# Patient Record
Sex: Female | Born: 1955 | Race: Black or African American | Hispanic: No | Marital: Single | Smoking: Current every day smoker
Health system: Southern US, Community
[De-identification: ages and names within clinical notes are randomized; demographics above are authoritative.]

## PROBLEM LIST (undated history)

## (undated) DIAGNOSIS — E119 Type 2 diabetes mellitus without complications: Secondary | ICD-10-CM

## (undated) DIAGNOSIS — E7800 Pure hypercholesterolemia, unspecified: Secondary | ICD-10-CM

## (undated) DIAGNOSIS — F419 Anxiety disorder, unspecified: Secondary | ICD-10-CM

## (undated) DIAGNOSIS — F32A Depression, unspecified: Secondary | ICD-10-CM

## (undated) DIAGNOSIS — I10 Essential (primary) hypertension: Secondary | ICD-10-CM

## (undated) HISTORY — PX: OTHER SURGICAL HISTORY: SHX169

## (undated) HISTORY — PX: ABDOMINAL HYSTERECTOMY: SHX81

---

## 2007-08-04 ENCOUNTER — Emergency Department (HOSPITAL_COMMUNITY): Admission: EM | Admit: 2007-08-04 | Discharge: 2007-08-04 | Payer: Self-pay | Admitting: Emergency Medicine

## 2007-12-29 ENCOUNTER — Emergency Department (HOSPITAL_COMMUNITY): Admission: EM | Admit: 2007-12-29 | Discharge: 2007-12-29 | Payer: Self-pay | Admitting: Emergency Medicine

## 2008-02-03 ENCOUNTER — Emergency Department (HOSPITAL_COMMUNITY): Admission: EM | Admit: 2008-02-03 | Discharge: 2008-02-03 | Payer: Self-pay | Admitting: Emergency Medicine

## 2008-03-01 ENCOUNTER — Ambulatory Visit (HOSPITAL_COMMUNITY): Admission: RE | Admit: 2008-03-01 | Discharge: 2008-03-01 | Payer: Self-pay | Admitting: Family Medicine

## 2008-03-03 ENCOUNTER — Emergency Department (HOSPITAL_COMMUNITY): Admission: EM | Admit: 2008-03-03 | Discharge: 2008-03-03 | Payer: Self-pay | Admitting: Emergency Medicine

## 2008-05-02 ENCOUNTER — Emergency Department (HOSPITAL_COMMUNITY): Admission: EM | Admit: 2008-05-02 | Discharge: 2008-05-02 | Payer: Self-pay | Admitting: Emergency Medicine

## 2008-05-31 ENCOUNTER — Emergency Department (HOSPITAL_COMMUNITY): Admission: EM | Admit: 2008-05-31 | Discharge: 2008-05-31 | Payer: Self-pay | Admitting: Emergency Medicine

## 2008-06-28 ENCOUNTER — Emergency Department (HOSPITAL_COMMUNITY): Admission: EM | Admit: 2008-06-28 | Discharge: 2008-06-28 | Payer: Self-pay | Admitting: Emergency Medicine

## 2008-07-02 ENCOUNTER — Ambulatory Visit (HOSPITAL_COMMUNITY): Admission: RE | Admit: 2008-07-02 | Discharge: 2008-07-02 | Payer: Self-pay | Admitting: General Surgery

## 2008-07-07 ENCOUNTER — Observation Stay (HOSPITAL_COMMUNITY): Admission: RE | Admit: 2008-07-07 | Discharge: 2008-07-08 | Payer: Self-pay | Admitting: General Surgery

## 2008-07-07 ENCOUNTER — Encounter (INDEPENDENT_AMBULATORY_CARE_PROVIDER_SITE_OTHER): Payer: Self-pay | Admitting: General Surgery

## 2008-08-03 ENCOUNTER — Ambulatory Visit (HOSPITAL_COMMUNITY): Admission: RE | Admit: 2008-08-03 | Discharge: 2008-08-03 | Payer: Self-pay | Admitting: Orthopedic Surgery

## 2008-09-06 IMAGING — CT CT PELVIS W/ CM
1 of 3 series · 14 of 32 positions shown, 19 images · IV contrast (Omnipaque 300)
Comparison: None

CT ABDOMEN

CLINICAL DATA: Upper and mid abdominal pain, back pain, nausea,
vomiting, diabetes

CT ABDOMEN AND PELVIS WITH CONTRAST
TECHNIQUE: Multidetector CT imaging of the abdomen and pelvis was
performed using the standard protocol following bolus
administration of intravenous contrast. Sagittal and coronal images
reconstructed from axial data set.
Contrast: Dilute oral contrast and 100 ml [id number].

[Series 2: abd_pel 5.0 b40f · axial · 0.78mm/px · z∈[-467,-52]mm · 14 of 95 slices shown, 19 images]
[im 6/95  soft-tissue]
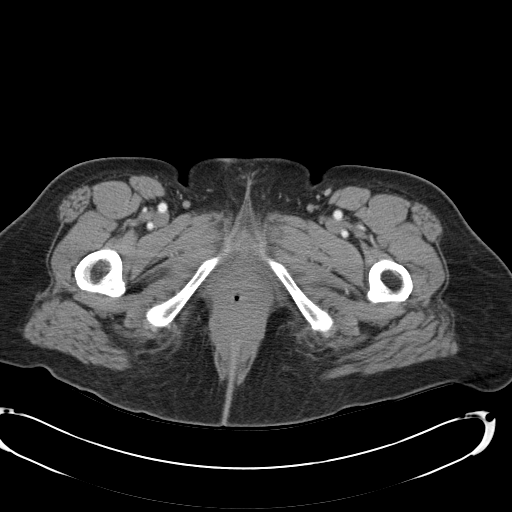
[im 6/95  bone]
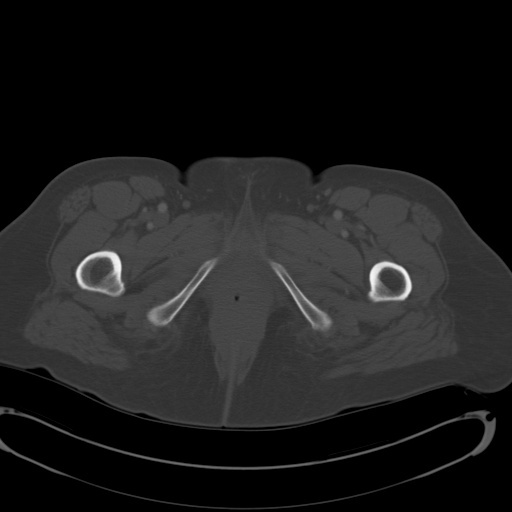
[im 12/95  soft-tissue]
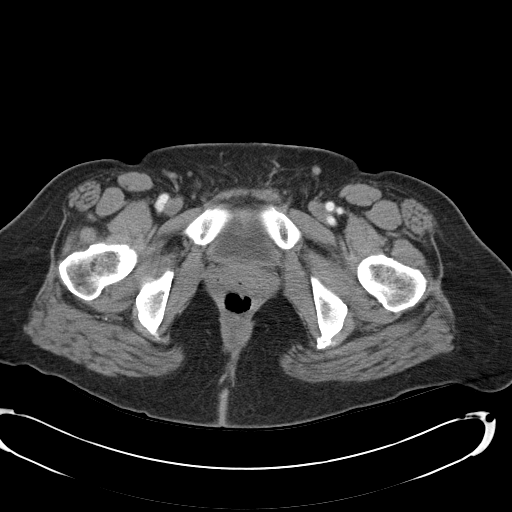
[im 23/95  soft-tissue]
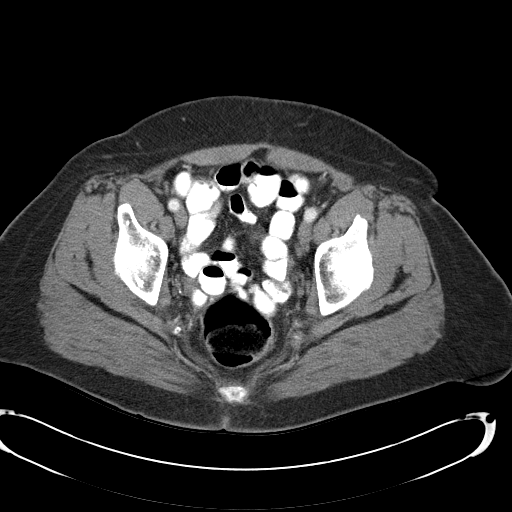
[im 28/95  soft-tissue]
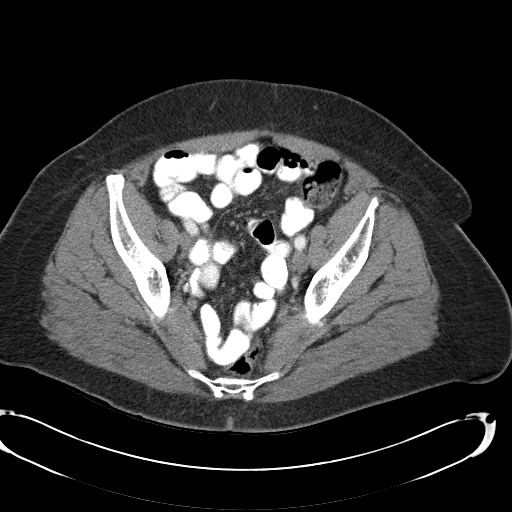
[im 34/95  soft-tissue]
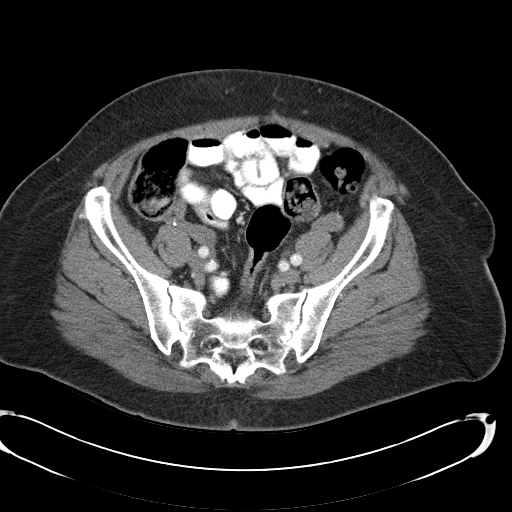
[im 39/95  soft-tissue]
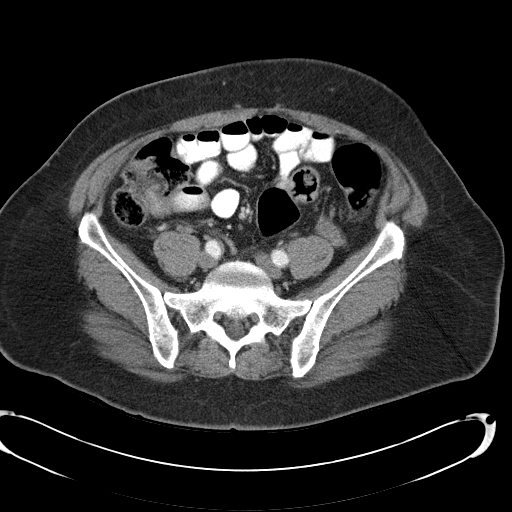
[im 50/95  soft-tissue]
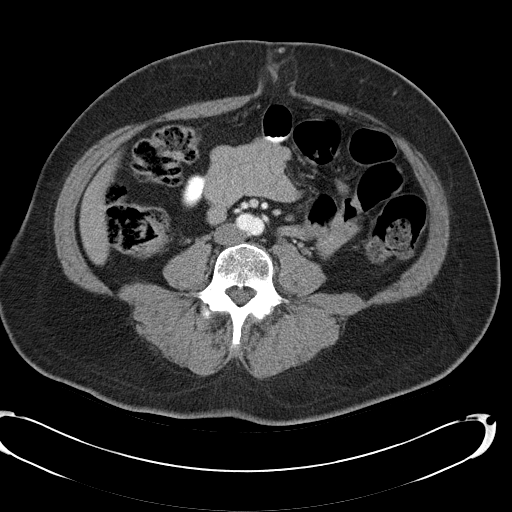
[im 56/95  soft-tissue]
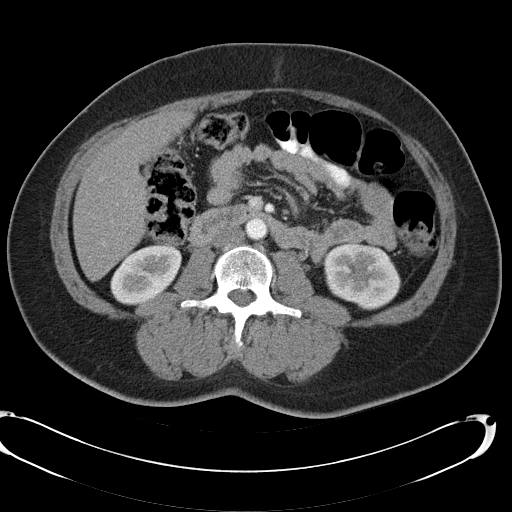
[im 61/95  soft-tissue]
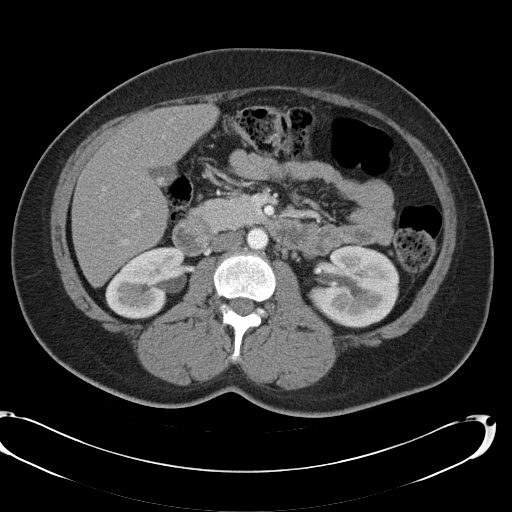
[im 61/95  bone]
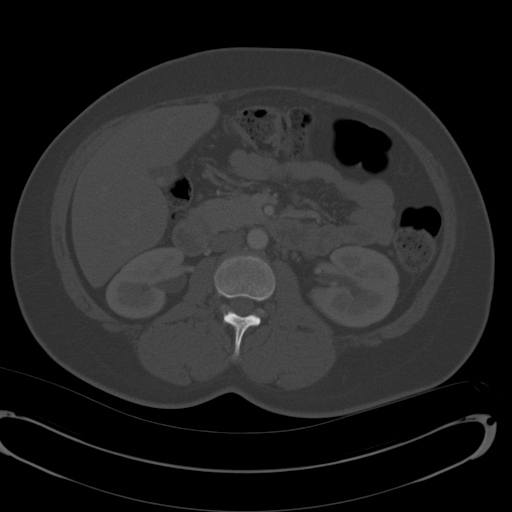
[im 67/95  soft-tissue]
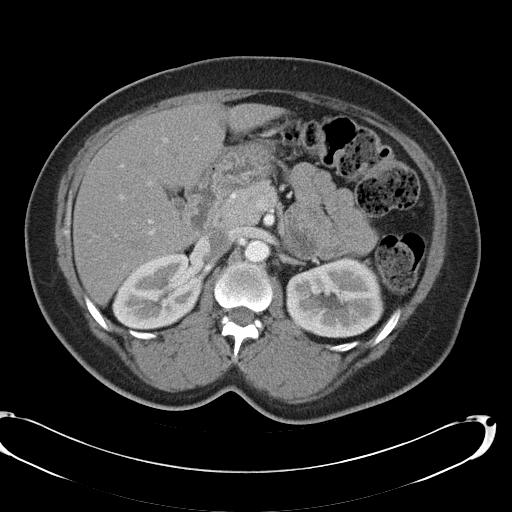
[im 72/95  soft-tissue]
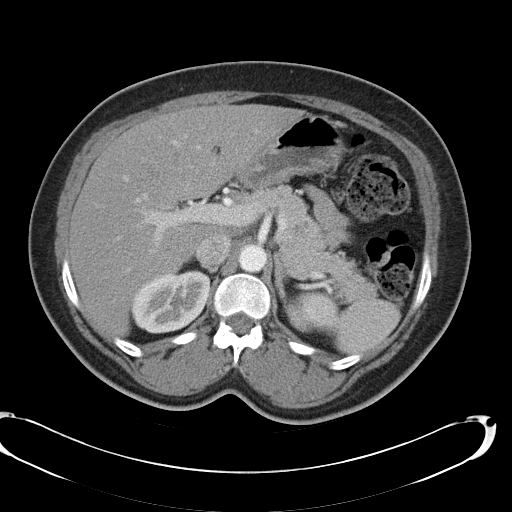
[im 72/95  lung]
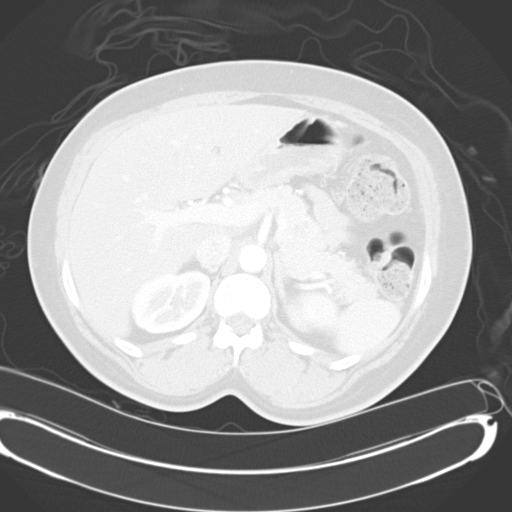
[im 78/95  lung]
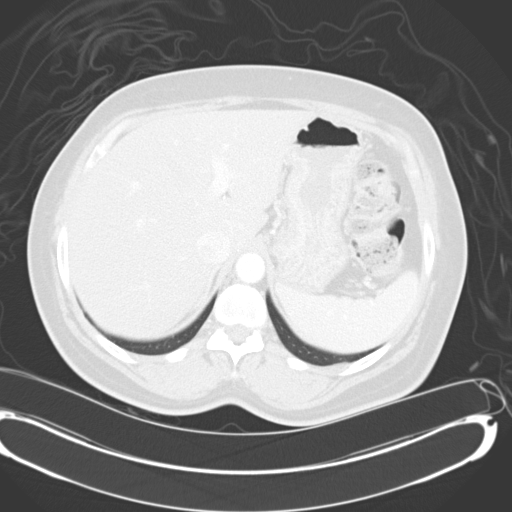
[im 83/95  soft-tissue]
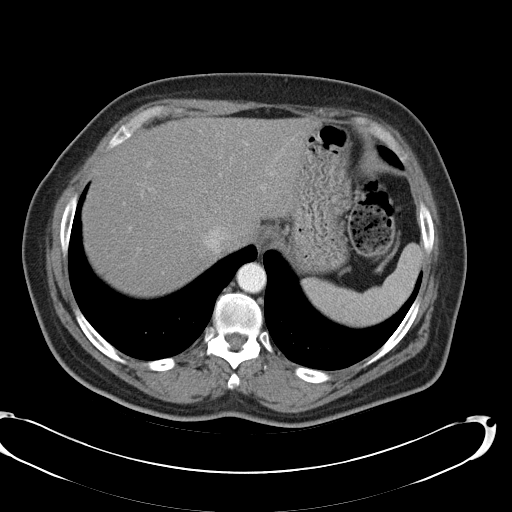
[im 83/95  lung]
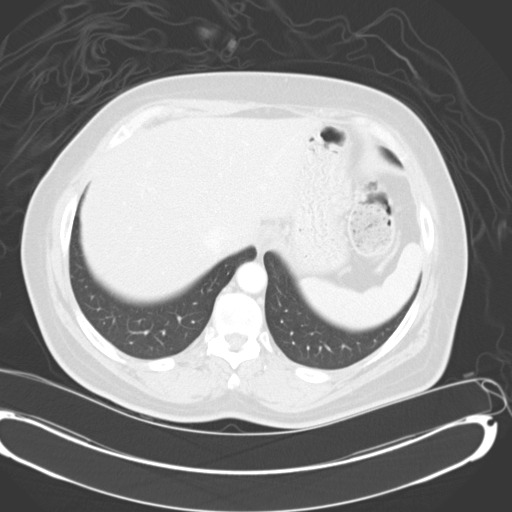
[im 89/95  soft-tissue]
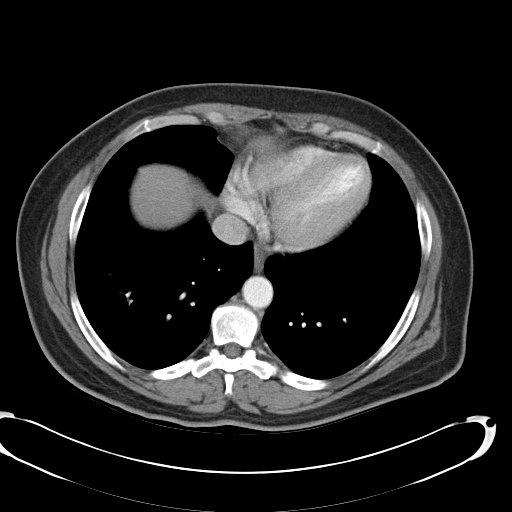
[im 89/95  lung]
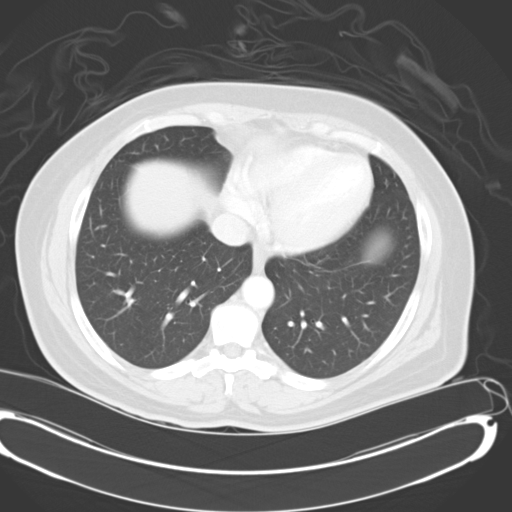

[14 of 32 positions shown; findings below may reference images not displayed]

FINDINGS: Lung bases clear.
Small umbilical hernia containing fat.
Cholelithiasis.
Mild focal fatty infiltration of liver adjacent to falciform
fissure.
Remainder of liver, spleen, pancreas, kidneys, and adrenal glands
normal.
Stool throughout colon.
Small bowel loops normal.
Stomach under distended, suboptimal assessment, no gross
abnormalities seen.
No upper abdominal mass, adenopathy, free fluid, or inflammatory
process.
IMPRESSION: Cholelithiasis.
Small umbilical hernia.
No acute upper abdominal abnormalities.

CT PELVIS
FINDINGS: Uterus surgically absent with nonvisualization of left ovary.
Right ovary normal size.
Scattered phleboliths left pelvis.
No mass, adenopathy, free fluid, or inflammatory process.
Bladder and distal ureters unremarkable.
Appendix not identified.
Bones unremarkable.
IMPRESSION: No acute intrapelvic abnormalities.

## 2009-01-05 IMAGING — US US ABDOMEN COMPLETE
1 series · 13 of 25 positions shown · non-contrast
Comparison: CT scan February 2008

CLINICAL DATA: Cholelithiasis

ABDOMEN ULTRASOUND
TECHNIQUE: Complete abdominal ultrasound examination was performed
including evaluation of the liver, gallbladder, bile ducts,
pancreas, kidneys, spleen, IVC, and abdominal aorta.

[Series 1: unknown · 0.25mm/px · 13 of 70 slices shown]
[im 1/70]
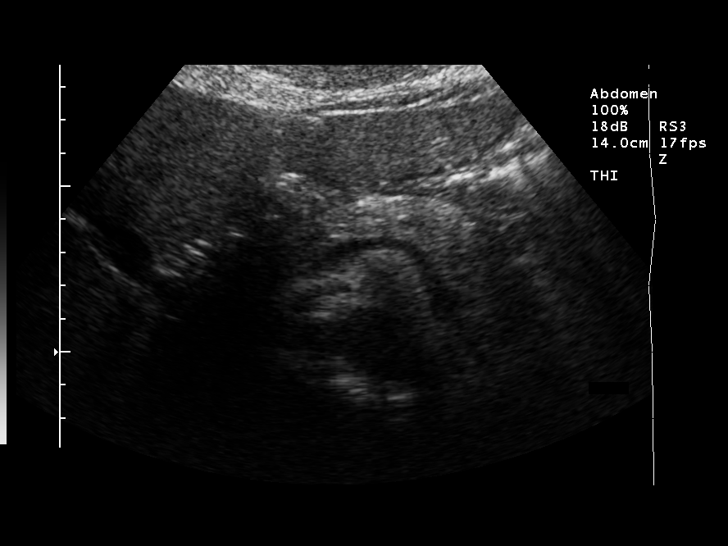
[im 6/70]
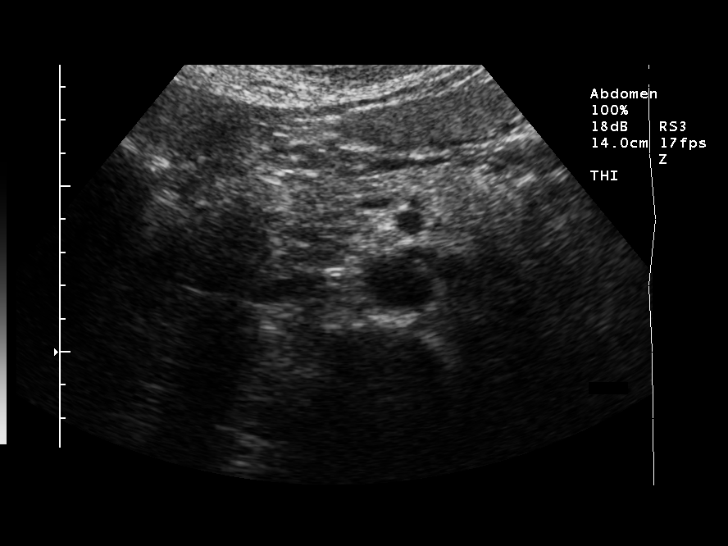
[im 12/70]
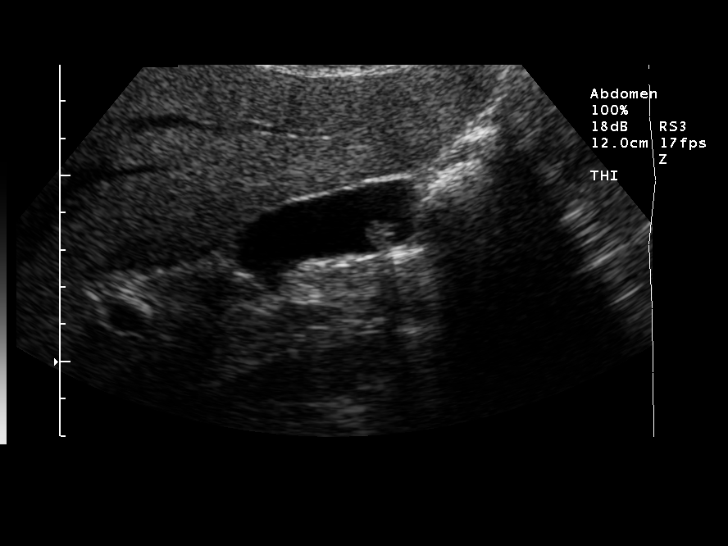
[im 18/70]
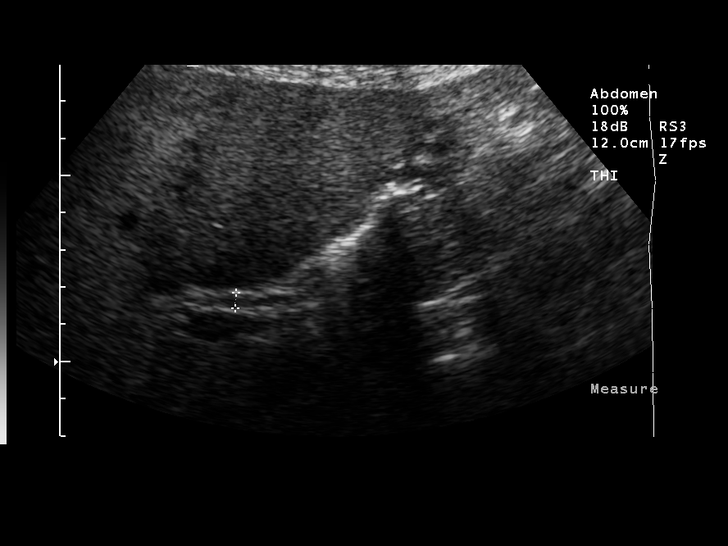
[im 24/70]
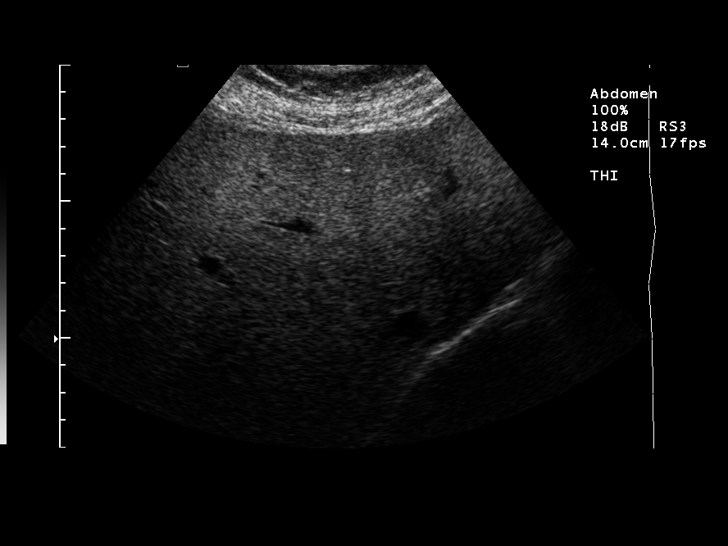
[im 29/70]
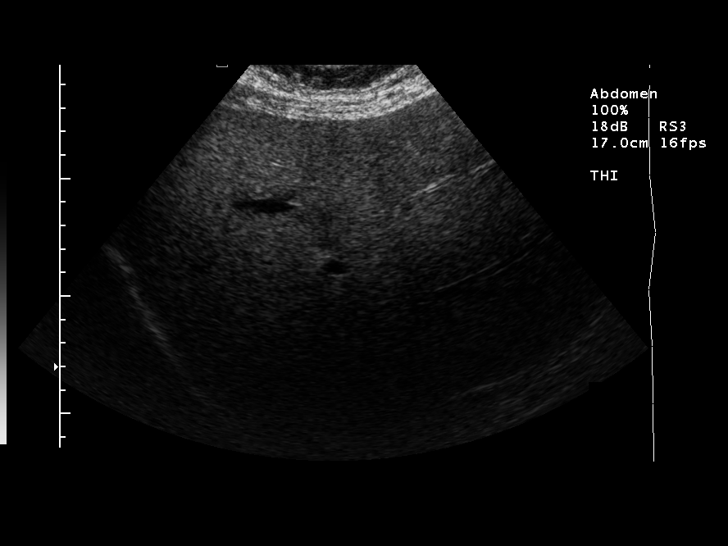
[im 35/70]
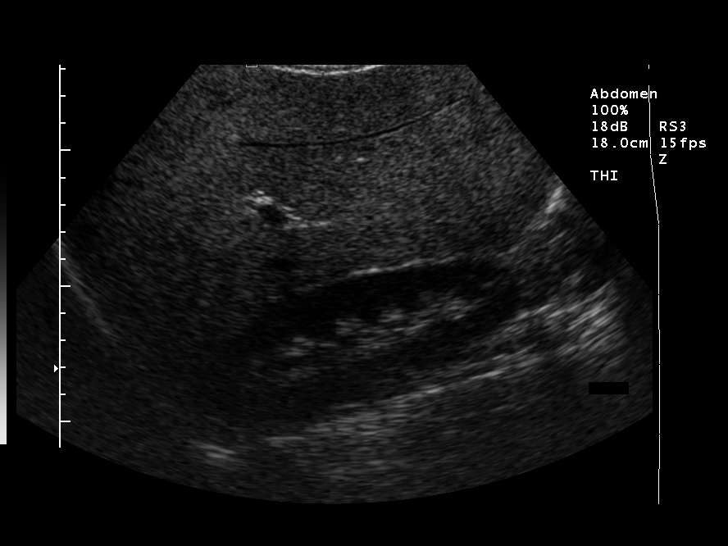
[im 41/70]
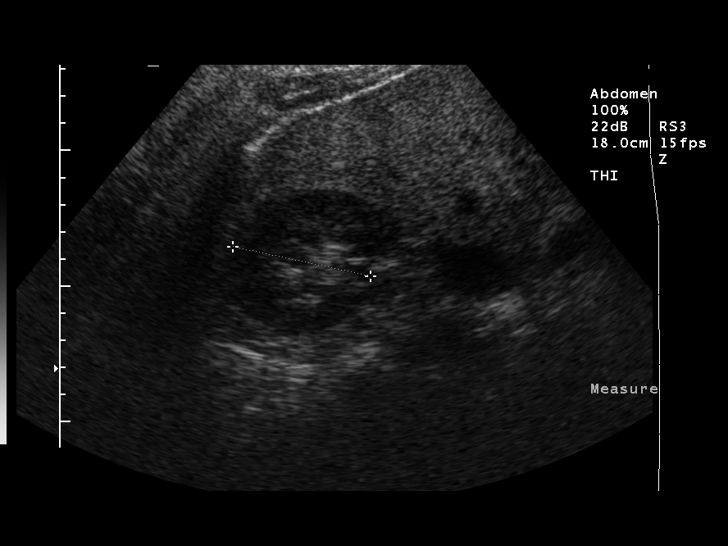
[im 47/70]
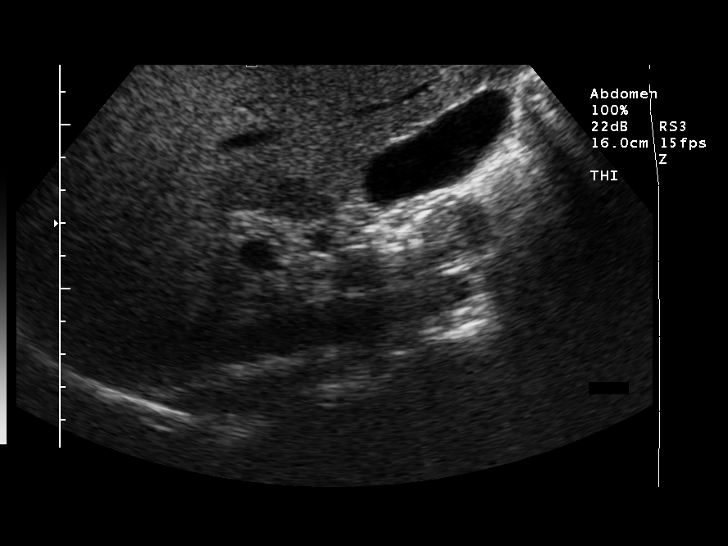
[im 52/70]
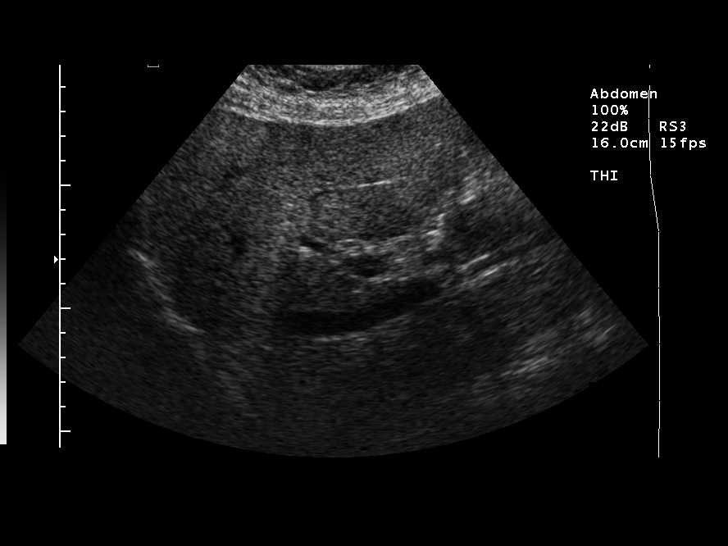
[im 58/70]
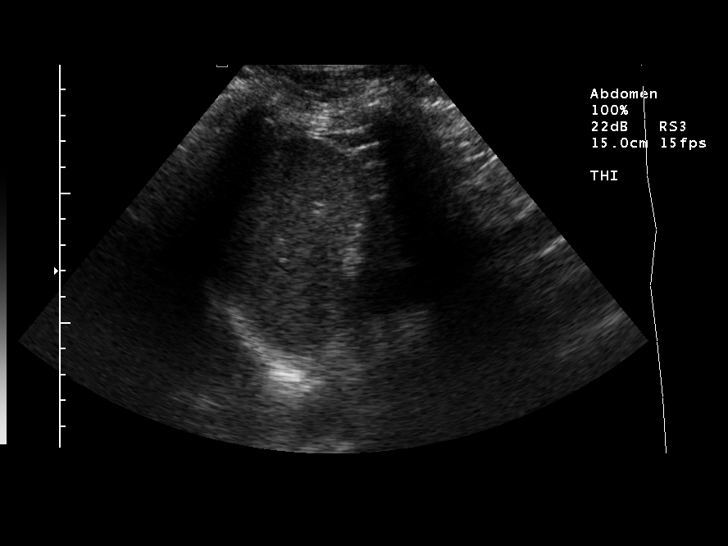
[im 64/70]
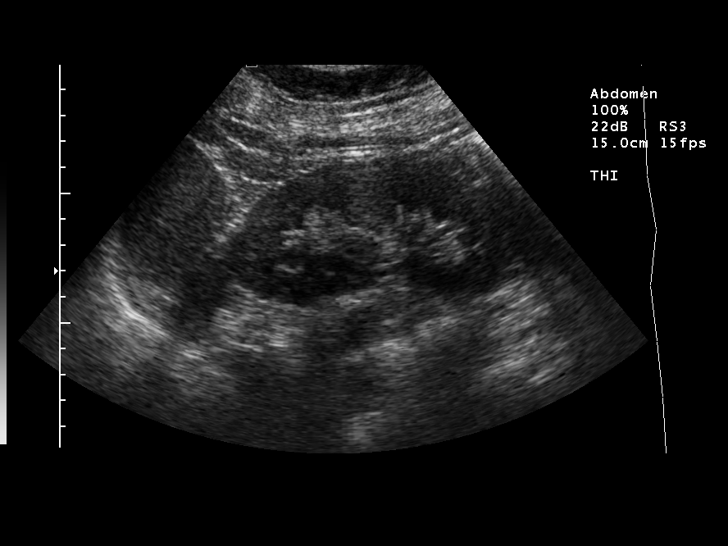
[im 70/70]
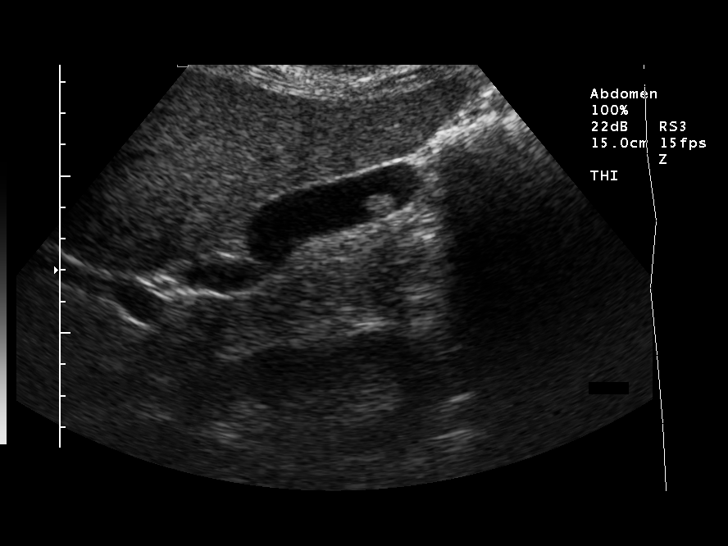

[13 of 25 positions shown; findings below may reference images not displayed]

FINDINGS: The liver is slightly echogenic consist with mild diffuse
fatty infiltration.  No focal hepatic lesions or intrahepatic
ductal dilatation.  The common bile duct measures 4.2 mm which is
within normal limits.  There is an echogenic focus in the
gallbladder with some associated shadowing.  This is not mobile and
may reflect a large gallbladder polyp or adherent gallstone.  No
gallbladder wall thickening or pericholecystic fluid.

The pancreas is well visualized demonstrates no sonographic
abnormalities.  The IVC and aorta are normal in caliber.  Mild
atherosclerotic changes involving the distal aorta.

The spleen is normal in size and demonstrates normal echogenicity
without focal lesions.  The right kidney measures 12.5 cm and the
left kidney measures 11.4 cm.  Both kidneys demonstrate normal
echogenicity and renal cortical thickness without focal lesions or
hydronephrosis peri
IMPRESSION: 1.  9 mm non mobile echogenic focus in the gallbladder may be an
adherent gallstone or a large gallbladder polyp.  Minimal
shadowing.  No findings for acute cholecystitis.
2.  Normal caliber common bile duct.
3.  Mild diffuse fatty infiltration of the liver.
4.  Normal sonographic appearance of the pancreas, spleen and both
kidneys.

## 2011-03-20 NOTE — Op Note (Signed)
Grace Thompson, Grace Thompson               ACCOUNT NO.:  [id number]   MEDICAL RECORD NO.:  [id number]          PATIENT TYPE:  INP   LOCATION:  A319                          FACILITY:  [facility]   PHYSICIAN:  Barbaraann Barthel, M.D. DATE OF BIRTH:  October 22, 1956   DATE OF PROCEDURE:  07/07/2008  DATE OF DISCHARGE:                               OPERATIVE REPORT   SURGEON:  Barbaraann Barthel, MD   PREOPERATIVE DIAGNOSIS:  1. Cholecystitis and cholelithiasis.  2. Umbilical hernia.   POSTOPERATIVE DIAGNOSES:  1. Cholecystitis and cholelithiasis.  2. Umbilical hernia.   PROCEDURES:  1. Laparoscopic cholecystectomy (no cholangiogram).  2. Umbilical herniorrhaphy.   SPECIMENS:  1. Gallbladder and stones.  2. Umbilical hernia sac with incarcerated omentum.   CASE CLASSIFICATION:  Clean contaminated.   NOTE:  This is a 55 year old black female [facility]  with history of right upper quadrant pain on and off for approximately a  year.  She was seen in the emergency room as well.  She was referred to  my office for gallbladder disease secondary to cholelithiasis when this  was noted on sonography.  Her liver function studies were grossly within  normal limits in July.  We repeated these and her liver function studies  were not elevated on the day of surgery either.   We discussed the need for surgery with this patient in detail discussing  complications not limited to but including bleeding, infection, damage  to bile ducts, perforation of organs, and transitory diarrhea.  We also  discussed the possibility of open surgery may be required.   I did not discuss the umbilical hernia with her.  We knew she had when  she had her sonography; however, this did not appear to be clinically  too large and I neglected to speak to her about having it fixed.  However at the time of surgery, this was actually quite a larger  umbilical hernia than I thought with incarcerated omentum.  Then, I  went  ahead and them fixed.  I will explain to her preoperatively and I am  sure she will be happy to have both surgeries taking care of at once.   GROSS OPERATIVE FINDINGS:  The patient had a very large floppy liver.  A  small cystic duct which was not cannulated.  A gallbladder that was  slightly intrahepatic. The right upper quadrant, otherwise, was within  normal limits.  The patient had incarcerated omentum and an umbilical  hernia.  I had actually made an incision well above her C-section  incisions, and she had had undergone 6 C-sections in the past, so we  remained free from those areas and so we were able to appreciate the  supraumbilical hernia that she had.  The gallbladder was grasped.  Its  adhesions were taken down.  The cystic duct was triply silver clipped as  was the cystic artery and divided the gallbladder and was removed from  the liver bed with a cautery device with minimal amount of oozing.  This  was removed using the Endosac device.  We then placed a  piece of  Surgicel on the liver bed and a Jackson-Pratt drain, which exited from  one of the 5-mm cannula sites in the right upper quadrant laterally.  We  then checked for hemostasis after this was done and we kept the cannulas  in place for possible assistance for the repair of the umbilical hernia.  We then closed the umbilical hernia with 0 Prolene with interrupted  sutures and also closed the cannula site with 0 Polysorb.  We then  looked through the scope.  Everything appeared normal without any  problems, and I used 0.5% Sensorcaine around all cannula sites for  postoperative comfort and then closed the skin with a stapling device  and sutured the drain in place with 3-0 nylon.   The pneumoperitoneum, I neglected to mention at the beginning of this  dictation was performed by making an incision above the umbilicus with  the patient in Trendelenburg and elevated the fascia with a towel clip  well above the  lower abdominal surgery.  I dissected down to the fascia  and then was able to place the Veress needle under direct vision and it  was confirmed to position with a saline drop test and then we put the 11-  mm cannula in this site using the Visiport technique.  Then, we were  able to place 3 other cannulas under direct vision without problem.  The  rest of the surgery was as described above.  There were no  complications.  The patient was taken to recovery room in satisfactory  condition.  Estimated blood loss was minimal.  The patient received 1100  mL of crystalloids intraoperatively.  A 1 Jackson-Pratt drain was placed  in the liver bed, and there were no complications.      Barbaraann Barthel, M.D.  Electronically Signed     WB/MEDQ  D:  07/07/2008  T:  07/08/2008  Job:  [id number]   cc:   [facility]

## 2011-07-27 LAB — URINALYSIS, ROUTINE W REFLEX MICROSCOPIC
Specific Gravity, Urine: 1.025
Urobilinogen, UA: 0.2

## 2011-07-27 LAB — URINE MICROSCOPIC-ADD ON

## 2011-07-27 LAB — URINE CULTURE

## 2011-07-30 LAB — CBC
HCT: 34.8 — ABNORMAL LOW
Hemoglobin: 12
RBC: 3.8 — ABNORMAL LOW
WBC: 6.7

## 2011-07-30 LAB — BASIC METABOLIC PANEL
Calcium: 9.6
GFR calc Af Amer: >60
GFR calc non Af Amer: >60
Glucose, Bld: 179 — ABNORMAL HIGH
Potassium: 3.8
Sodium: 136

## 2011-07-30 LAB — POCT CARDIAC MARKERS
CKMB, poc: 1.4
Myoglobin, poc: 98.4
Troponin i, poc: <0.05

## 2011-07-30 LAB — DIFFERENTIAL
Basophils Absolute: 0
Eosinophils Relative: 4
Lymphocytes Relative: 50 — ABNORMAL HIGH
Lymphs Abs: 3.3
Monocytes Absolute: 0.4
Neutro Abs: 2.7

## 2011-07-31 LAB — COMPREHENSIVE METABOLIC PANEL
ALT: 21
AST: 18
Albumin: 3.6
Calcium: 9.2
Creatinine, Ser: 0.93
GFR calc Af Amer: >60
Sodium: 142

## 2011-07-31 LAB — DIFFERENTIAL
Eosinophils Absolute: 0.3
Eosinophils Relative: 5
Lymphocytes Relative: 45
Lymphs Abs: 2.9
Monocytes Absolute: 0.5
Monocytes Relative: 8

## 2011-07-31 LAB — URINALYSIS, ROUTINE W REFLEX MICROSCOPIC
Bilirubin Urine: NEGATIVE
Hgb urine dipstick: NEGATIVE
Nitrite: NEGATIVE
Protein, ur: NEGATIVE
Specific Gravity, Urine: 1.01
Urobilinogen, UA: 0.2

## 2011-07-31 LAB — CBC
MCHC: 34.9
MCV: 89.8
Platelets: 248
RBC: 3.63 — ABNORMAL LOW
WBC: 6.3

## 2011-08-03 LAB — DIFFERENTIAL
Basophils Relative: 0
Eosinophils Absolute: 0.3
Eosinophils Relative: 5
Lymphs Abs: 3.8

## 2011-08-03 LAB — CBC
HCT: 38.2
MCHC: 33.9
MCV: 90.7
Platelets: 257
WBC: 6.4

## 2011-08-03 LAB — BASIC METABOLIC PANEL
BUN: 13
CO2: 29
Chloride: 101
Glucose, Bld: 203 — ABNORMAL HIGH
Potassium: 4.2

## 2011-08-03 LAB — HEPATIC FUNCTION PANEL
ALT: 30
Alkaline Phosphatase: 106
Bilirubin, Direct: 0.1
Indirect Bilirubin: 0.3

## 2011-08-03 LAB — LIPASE, BLOOD: Lipase: 29

## 2011-08-08 LAB — BASIC METABOLIC PANEL
BUN: 7
GFR calc Af Amer: >60
GFR calc non Af Amer: >60
Potassium: 4

## 2011-08-08 LAB — CBC
HCT: 35.9 — ABNORMAL LOW
Platelets: 258
RBC: 4
WBC: 10.3

## 2011-08-08 LAB — HEPATIC FUNCTION PANEL
ALT: 72 — ABNORMAL HIGH
Albumin: 3.5
Alkaline Phosphatase: 95
Total Protein: 6.6

## 2011-08-08 LAB — GLUCOSE, CAPILLARY
Glucose-Capillary: 237 — ABNORMAL HIGH
Glucose-Capillary: 282 — ABNORMAL HIGH

## 2011-08-08 LAB — DIFFERENTIAL
Eosinophils Relative: 1
Lymphocytes Relative: 27
Lymphs Abs: 2.8
Neutrophils Relative %: 65

## 2011-08-16 LAB — URINALYSIS, ROUTINE W REFLEX MICROSCOPIC
Glucose, UA: >1000 — AB
Leukocytes, UA: NEGATIVE
pH: 5.5

## 2011-08-16 LAB — URINE MICROSCOPIC-ADD ON: Urine-Other: NONE SEEN

## 2011-09-12 ENCOUNTER — Other Ambulatory Visit (HOSPITAL_COMMUNITY): Payer: Self-pay | Admitting: Physician Assistant

## 2011-09-12 DIAGNOSIS — Z139 Encounter for screening, unspecified: Secondary | ICD-10-CM

## 2011-09-17 ENCOUNTER — Other Ambulatory Visit: Payer: Self-pay | Admitting: Obstetrics and Gynecology

## 2011-09-17 DIAGNOSIS — Z139 Encounter for screening, unspecified: Secondary | ICD-10-CM

## 2011-09-18 ENCOUNTER — Ambulatory Visit (HOSPITAL_COMMUNITY): Payer: Self-pay

## 2013-08-18 ENCOUNTER — Emergency Department (HOSPITAL_COMMUNITY)
Admission: EM | Admit: 2013-08-18 | Discharge: 2013-08-18 | Discharge status: Against Medical Advice | Payer: Self-pay | Attending: Emergency Medicine | Admitting: Emergency Medicine

## 2013-08-18 ENCOUNTER — Encounter (HOSPITAL_COMMUNITY): Payer: Self-pay | Admitting: Emergency Medicine

## 2013-08-18 DIAGNOSIS — R109 Unspecified abdominal pain: Secondary | ICD-10-CM | POA: Insufficient documentation

## 2013-08-18 DIAGNOSIS — F17200 Nicotine dependence, unspecified, uncomplicated: Secondary | ICD-10-CM | POA: Insufficient documentation

## 2013-08-18 HISTORY — DX: Type 2 diabetes mellitus without complications: E11.9

## 2013-08-18 HISTORY — DX: Pure hypercholesterolemia, unspecified: E78.00

## 2013-08-18 HISTORY — DX: Essential (primary) hypertension: I10

## 2013-08-18 NOTE — ED Notes (Signed)
Pt reports pain to the right side of her abdomen for the past 2 months.  Pt denies any n/v/d.  Pt reports "its just taking my energy".

## 2013-08-20 ENCOUNTER — Encounter (HOSPITAL_COMMUNITY): Payer: Self-pay | Admitting: Emergency Medicine

## 2013-08-20 ENCOUNTER — Emergency Department (HOSPITAL_COMMUNITY): Payer: Self-pay

## 2013-08-20 ENCOUNTER — Emergency Department (HOSPITAL_COMMUNITY)
Admission: EM | Admit: 2013-08-20 | Discharge: 2013-08-20 | Disposition: A | Discharge status: Home / Self Care | Payer: Self-pay | Attending: Emergency Medicine | Admitting: Emergency Medicine

## 2013-08-20 DIAGNOSIS — Z794 Long term (current) use of insulin: Secondary | ICD-10-CM | POA: Insufficient documentation

## 2013-08-20 DIAGNOSIS — R1011 Right upper quadrant pain: Secondary | ICD-10-CM | POA: Insufficient documentation

## 2013-08-20 DIAGNOSIS — R109 Unspecified abdominal pain: Secondary | ICD-10-CM

## 2013-08-20 DIAGNOSIS — F17200 Nicotine dependence, unspecified, uncomplicated: Secondary | ICD-10-CM | POA: Insufficient documentation

## 2013-08-20 DIAGNOSIS — R1031 Right lower quadrant pain: Secondary | ICD-10-CM | POA: Insufficient documentation

## 2013-08-20 DIAGNOSIS — E7800 Pure hypercholesterolemia, unspecified: Secondary | ICD-10-CM | POA: Insufficient documentation

## 2013-08-20 DIAGNOSIS — I10 Essential (primary) hypertension: Secondary | ICD-10-CM | POA: Insufficient documentation

## 2013-08-20 DIAGNOSIS — E119 Type 2 diabetes mellitus without complications: Secondary | ICD-10-CM | POA: Insufficient documentation

## 2013-08-20 LAB — COMPREHENSIVE METABOLIC PANEL
ALT: 30 U/L (ref 0–35)
AST: 24 U/L (ref 0–37)
Albumin: 3.9 g/dL (ref 3.5–5.2)
Alkaline Phosphatase: 122 U/L — ABNORMAL HIGH (ref 39–117)
GFR calc Af Amer: >90 mL/min (ref >90)
Glucose, Bld: 266 mg/dL — ABNORMAL HIGH (ref 70–99)
Potassium: 4.3 mEq/L (ref 3.5–5.1)
Sodium: 135 mEq/L (ref 135–145)
Total Protein: 7.5 g/dL (ref 6.0–8.3)

## 2013-08-20 LAB — CBC WITH DIFFERENTIAL/PLATELET
Basophils Absolute: 0 10*3/uL (ref 0.0–0.1)
Eosinophils Absolute: 0.2 10*3/uL (ref 0.0–0.7)
Lymphocytes Relative: 59 % — ABNORMAL HIGH (ref 12–46)
Lymphs Abs: 3.3 10*3/uL (ref 0.7–4.0)
MCH: 32.4 pg (ref 26.0–34.0)
Neutrophils Relative %: 32 % — ABNORMAL LOW (ref 43–77)
Platelets: 248 10*3/uL (ref 150–400)
RBC: 4.57 MIL/uL (ref 3.87–5.11)
WBC: 5.6 10*3/uL (ref 4.0–10.5)

## 2013-08-20 LAB — URINALYSIS, ROUTINE W REFLEX MICROSCOPIC
Nitrite: NEGATIVE
Specific Gravity, Urine: >1.03 — ABNORMAL HIGH (ref 1.005–1.030)
Urobilinogen, UA: 0.2 mg/dL (ref 0.0–1.0)

## 2013-08-20 MED ORDER — ONDANSETRON HCL 4 MG/2ML IJ SOLN
4.0000 mg | Freq: Once | INTRAMUSCULAR | Status: AC
  Administered 2013-08-20: 4 mg via INTRAVENOUS
  Filled 2013-08-20: qty 2

## 2013-08-20 MED ORDER — SODIUM CHLORIDE 0.9 % IV BOLUS (SEPSIS)
1000.0000 mL | Freq: Once | INTRAVENOUS | Status: AC
  Administered 2013-08-20: 1000 mL via INTRAVENOUS

## 2013-08-20 MED ORDER — HYDROCODONE-ACETAMINOPHEN 5-325 MG PO TABS: 2.0000 | ORAL_TABLET | ORAL | Status: DC | PRN

## 2013-08-20 MED ORDER — IOHEXOL 300 MG/ML  SOLN
100.0000 mL | Freq: Once | INTRAMUSCULAR | Status: AC | PRN
  Administered 2013-08-20: 100 mL via INTRAVENOUS

## 2013-08-20 MED ORDER — IOHEXOL 300 MG/ML  SOLN
50.0000 mL | Freq: Once | INTRAMUSCULAR | Status: AC | PRN
  Administered 2013-08-20: 50 mL via ORAL

## 2013-08-20 MED ORDER — MORPHINE SULFATE 4 MG/ML IJ SOLN
4.0000 mg | Freq: Once | INTRAMUSCULAR | Status: AC
  Administered 2013-08-20: 4 mg via INTRAVENOUS
  Filled 2013-08-20: qty 1

## 2013-08-20 NOTE — ED Provider Notes (Signed)
CSN: [id number]     Arrival date & time 08/20/13  1478 History   First MD Initiated Contact with Patient 08/20/13 [phone]     Chief Complaint  Patient presents with  . Abdominal Pain   (Consider location/radiation/quality/duration/timing/severity/associated sxs/prior Treatment) HPI Comments: Patient is a 57 year old female with past medical history significant for diabetes, hypertension, high cholesterol. She presents today with complaints of right-sided abdominal pain for the past 2 months. She states it is primarily in the right upper quadrant radiates down the right lower quadrant. She denies any fevers or chills. She denies any urinary complaints. She tells me that she had a cholecystectomy performed several years ago but has not had any problems since that time.  Patient is a 57 y.o. female presenting with abdominal pain. The history is provided by the patient.  Abdominal Pain Pain location:  RUQ and RLQ Pain quality: burning and cramping   Pain radiates to:  Does not radiate Pain severity:  Moderate Onset quality:  Gradual Duration: 2 months. Timing:  Constant Progression:  Worsening Chronicity:  New Context: not diet changes and not eating   Relieved by:  Nothing Worsened by:  Nothing tried Associated symptoms: no chest pain, no dysuria and no fever     Past Medical History  Diagnosis Date  . Diabetes mellitus without complication   . Hypertension   . High cholesterol    Past Surgical History  Procedure Laterality Date  . Gall stone removal    . Abdominal hysterectomy     No family history on file. History  Substance Use Topics  . Smoking status: Current Every Day Smoker  . Smokeless tobacco: Not on file  . Alcohol Use: Yes   OB History   Grav Para Term Preterm Abortions TAB SAB Ect Mult Living                 Review of Systems  Constitutional: Negative for fever.  Cardiovascular: Negative for chest pain.  Gastrointestinal: Positive for abdominal pain.   Genitourinary: Negative for dysuria.  All other systems reviewed and are negative.    Allergies  Review of patient's allergies indicates no known allergies.  Home Medications   Current Outpatient Rx  Name  Route  Sig  Dispense  Refill  . insulin NPH-regular (NOVOLIN 70/30) (70-30) 100 UNIT/ML injection   Subcutaneous   Inject 15 Units into the skin 2 (two) times daily with a meal.          BP 153/100  Pulse 91  Temp(Src) 98.7 F (37.1 C) (Oral)  Resp 18  Ht 5\' 7"  (1.702 m)  Wt 174 lb (78.926 kg)  BMI 27.25 kg/m2  SpO2 99% Physical Exam  Nursing note and vitals reviewed. Constitutional: She is oriented to person, place, and time. She appears well-developed and well-nourished. No distress.  HENT:  Head: Normocephalic and atraumatic.  Neck: Normal range of motion. Neck supple.  Cardiovascular: Normal rate and regular rhythm.  Exam reveals no gallop and no friction rub.   No murmur heard. Pulmonary/Chest: Effort normal and breath sounds normal. No respiratory distress. She has no wheezes.  Abdominal: Soft. Bowel sounds are normal. She exhibits distension. There is no tenderness.  Is tenderness to palpation in the right upper quadrant without rebound or guarding.  Musculoskeletal: Normal range of motion.  Neurological: She is alert and oriented to person, place, and time.  Skin: Skin is warm and dry. She is not diaphoretic.    ED Course  Procedures (including critical  care time) Labs Review Labs Reviewed  URINALYSIS, ROUTINE W REFLEX MICROSCOPIC  CBC WITH DIFFERENTIAL  COMPREHENSIVE METABOLIC PANEL  LIPASE, BLOOD   Imaging Review No results found.  EKG Interpretation   None       MDM  No diagnosis found. Patient presents here with complaints of right upper quadrant abdominal pain for the past 2 months. Physical exam reveals patient to be afebrile with stable vital signs. She is tender to palpation in the right upper quadrant without rebound or guarding. As  this pain has been worsening over the past 2 months and she is now achieving no relief with over-the-counter medications, and I decided to order a CT scan of the abdomen and pelvis to rule out intra-abdominal pathology. Patient's care will be signed out at shift change to the oncoming provider who will obtain the results of the CT scan and determine the final disposition.    Geoffery Lyons, MD 08/20/13 [phone]

## 2013-08-20 NOTE — ED Provider Notes (Signed)
7:53 AM- Pt left at change of shift to get [facility] CT results. Informed the pt that her CT scans were normal. Advised the pt that her lipase is mildly high which can be a sign of a pancreatitis, but that the CT did not indicate any inflammation of pancreas. The pt states the pain is to her right upper quadrant to right lower quadrant, and that is has been present constantly for two months; the pain is waxing and waning. The pt experiences relief from the pain at night when she is sleeping; the pain returns upon awakening. She describes the pain as "aching" and "burning." She has not been able to determine anything which increases the pain. She denies increased pain with movement or with eating. The pt reports that Advil and tylenol lessen the pain.  She denies experiencing any back pain. She also denies being treated by a GI or having a colonoscopy. Informed the pt that she would be provided the name of a GI with whom to follow up.   [facility] and is working to apply for [facility].   The pt is expecting to begin work at [employer]. She used to work at [employer].   She states that she does worry a lot during the day.   Results for orders placed during the hospital encounter of 08/20/13  URINALYSIS, ROUTINE W REFLEX MICROSCOPIC      Result Value Range   Color, Urine YELLOW  YELLOW   APPearance CLEAR  CLEAR   Specific Gravity, Urine >1.030 (*) 1.005 - 1.030   pH 5.5  5.0 - 8.0   Glucose, UA 250 (*) NEGATIVE mg/dL   Hgb urine dipstick NEGATIVE  NEGATIVE   Bilirubin Urine NEGATIVE  NEGATIVE   Ketones, ur NEGATIVE  NEGATIVE mg/dL   Protein, ur NEGATIVE  NEGATIVE mg/dL   Urobilinogen, UA 0.2  0.0 - 1.0 mg/dL   Nitrite NEGATIVE  NEGATIVE   Leukocytes, UA NEGATIVE  NEGATIVE  CBC WITH DIFFERENTIAL      Result Value Range   WBC 5.6  4.0 - 10.5 K/uL   RBC 4.57  3.87 - 5.11 MIL/uL   Hemoglobin 14.8  12.0 - 15.0 g/dL   HCT 40.9  81.1 - 91.4 %   MCV 91.5  78.0 - 100.0 fL    MCH 32.4  26.0 - 34.0 pg   MCHC 35.4  30.0 - 36.0 g/dL   RDW 78.2  95.6 - 21.3 %   Platelets 248  150 - 400 K/uL   Neutrophils Relative % 32 (*) 43 - 77 %   Neutro Abs 1.8  1.7 - 7.7 K/uL   Lymphocytes Relative 59 (*) 12 - 46 %   Lymphs Abs 3.3  0.7 - 4.0 K/uL   Monocytes Relative 5  3 - 12 %   Monocytes Absolute 0.3  0.1 - 1.0 K/uL   Eosinophils Relative 3  0 - 5 %   Eosinophils Absolute 0.2  0.0 - 0.7 K/uL   Basophils Relative 1  0 - 1 %   Basophils Absolute 0.0  0.0 - 0.1 K/uL  COMPREHENSIVE METABOLIC PANEL      Result Value Range   Sodium 135  135 - 145 mEq/L   Potassium 4.3  3.5 - 5.1 mEq/L   Chloride 101  96 - 112 mEq/L   CO2 22  19 - 32 mEq/L   Glucose, Bld 266 (*) 70 - 99 mg/dL   BUN 19  6 - 23 mg/dL   Creatinine, Ser 4.54  0.50 - 1.10 mg/dL   Calcium 9.7  8.4 - 09.8 mg/dL   Total Protein 7.5  6.0 - 8.3 g/dL   Albumin 3.9  3.5 - 5.2 g/dL   AST 24  0 - 37 U/L   ALT 30  0 - 35 U/L   Alkaline Phosphatase 122 (*) 39 - 117 U/L   Total Bilirubin 0.3  0.3 - 1.2 mg/dL   GFR calc non Af Amer >90  >90 mL/min   GFR calc Af Amer >90  >90 mL/min  LIPASE, BLOOD      Result Value Range   Lipase 75 (*) 11 - 59 U/L   Ct Abdomen Pelvis W Contrast  08/20/2013   CLINICAL DATA:  Right-sided abdominal pain. Prior history of cholecystectomy and hysterectomy.  EXAM: CT ABDOMEN AND PELVIS WITH CONTRAST  TECHNIQUE: Multidetector CT imaging of the abdomen and pelvis was performed using the standard protocol following bolus administration of intravenous contrast.  CONTRAST:  50mL OMNIPAQUE IOHEXOL 300 MG/ML SOLN, OMNIPAQUE IOHEXOL 300 MG/ML SOLN  COMPARISON:  CT abdomen 03/03/2008  FINDINGS: Lung bases are clear. No pericardial fluid.  No focal hepatic lesion. Patient status post cholecystectomy. The pancreas, spleen, adrenal glands, and kidneys are normal.  Stomach, small bowel, appendix, and cecum are normal. The colon rectosigmoid colon are normal.  Abdominal aorta is normal caliber. No  retroperitoneal or periportal lymphadenopathy.  No free fluid the pelvis. Post hysterectomy. No distal ureteral stones or bladder stones. No pelvic lymphadenopathy. Review of bone windows demonstrates no aggressive osseous lesions.  IMPRESSION: 1. No acute abdominal or pelvic findings.  2.  Patient status post cholecystectomy and hysterectomy.  3.  Normal appendix.   Electronically Signed   By: Genevive Bi M.D.   On: 08/20/2013 07:37   None  Final diagnoses:  Abdominal pain    Plan discharge   Devoria Albe, MD, Franz Dell, MD 08/20/13 [phone]

## 2013-08-20 NOTE — ED Notes (Signed)
Patient c/o right side pain x 2 months.  States it is constant, states it burns, itches and aches.  Denies urinary symptoms, nausea or vomiting.

## 2014-12-02 ENCOUNTER — Encounter (HOSPITAL_COMMUNITY): Payer: Self-pay

## 2014-12-02 ENCOUNTER — Emergency Department (HOSPITAL_COMMUNITY)
Admission: EM | Admit: 2014-12-02 | Discharge: 2014-12-02 | Disposition: A | Discharge status: Home / Self Care | Payer: Self-pay | Attending: Emergency Medicine | Admitting: Emergency Medicine

## 2014-12-02 DIAGNOSIS — I10 Essential (primary) hypertension: Secondary | ICD-10-CM | POA: Insufficient documentation

## 2014-12-02 DIAGNOSIS — E119 Type 2 diabetes mellitus without complications: Secondary | ICD-10-CM | POA: Insufficient documentation

## 2014-12-02 DIAGNOSIS — E780 Pure hypercholesterolemia: Secondary | ICD-10-CM | POA: Insufficient documentation

## 2014-12-02 DIAGNOSIS — N390 Urinary tract infection, site not specified: Secondary | ICD-10-CM | POA: Insufficient documentation

## 2014-12-02 DIAGNOSIS — F17210 Nicotine dependence, cigarettes, uncomplicated: Secondary | ICD-10-CM | POA: Insufficient documentation

## 2014-12-02 LAB — URINE MICROSCOPIC-ADD ON

## 2014-12-02 LAB — URINALYSIS, ROUTINE W REFLEX MICROSCOPIC
BILIRUBIN URINE: NEGATIVE
Glucose, UA: NEGATIVE mg/dL
KETONES UR: NEGATIVE mg/dL
Nitrite: NEGATIVE
PROTEIN: 100 mg/dL — AB
Urobilinogen, UA: 0.2 mg/dL (ref 0.0–1.0)
pH: 6 (ref 5.0–8.0)

## 2014-12-02 MED ORDER — KETOROLAC TROMETHAMINE 10 MG PO TABS
10.0000 mg | ORAL_TABLET | Freq: Once | ORAL | Status: AC
  Administered 2014-12-02: 10 mg via ORAL
  Filled 2014-12-02: qty 1

## 2014-12-02 MED ORDER — CEPHALEXIN 500 MG PO CAPS
500.0000 mg | ORAL_CAPSULE | Freq: Once | ORAL | Status: AC
  Administered 2014-12-02: 500 mg via ORAL
  Filled 2014-12-02: qty 1

## 2014-12-02 MED ORDER — ONDANSETRON HCL 4 MG PO TABS
4.0000 mg | ORAL_TABLET | Freq: Once | ORAL | Status: AC
  Administered 2014-12-02: 4 mg via ORAL
  Filled 2014-12-02: qty 1

## 2014-12-02 MED ORDER — PHENAZOPYRIDINE HCL 100 MG PO TABS
100.0000 mg | ORAL_TABLET | Freq: Once | ORAL | Status: AC
  Administered 2014-12-02: 100 mg via ORAL
  Filled 2014-12-02: qty 1

## 2014-12-02 MED ORDER — HYDROCODONE-ACETAMINOPHEN 5-325 MG PO TABS: 1.0000 | ORAL_TABLET | ORAL | Status: DC | PRN

## 2014-12-02 MED ORDER — CEPHALEXIN 500 MG PO CAPS: 500.0000 mg | ORAL_CAPSULE | Freq: Four times a day (QID) | ORAL | Status: DC

## 2014-12-02 NOTE — ED Notes (Signed)
Gave patient sprite zero as requested and approved by PA.

## 2014-12-02 NOTE — ED Provider Notes (Signed)
CSN: [id number]     Arrival date & time 12/02/14  1711 History   First MD Initiated Contact with Patient 12/02/14 1803     Chief Complaint  Patient presents with  . Hematuria     (Consider location/radiation/quality/duration/timing/severity/associated sxs/prior Treatment) HPI Comments: Patient is a 59 year old female who presents to the emergency department with "blood in my urine". The patient states that she has been having increasing urine frequency over the last week. In the last 3 days she's been noticing some blood staining in a pad that she wears, and has noticed that her urine has more of a rust color, and she is concerned that there may be blood present. She states she's had a urinary tract infection in the past, and this feels a lot like it. She describes a heavy sensation over the area of her bladder. She also describes increased urine frequency. She has not had any high fever to be reported. She has not had any nausea vomiting or back pain. Nothing makes the pain better, nothing makes the pain worse.  Patient is a 59 y.o. female presenting with hematuria. The history is provided by the patient.  Hematuria This is a new problem. Pertinent negatives include no abdominal pain, arthralgias, chest pain, coughing or neck pain.    Past Medical History  Diagnosis Date  . Diabetes mellitus without complication   . Hypertension   . High cholesterol    Past Surgical History  Procedure Laterality Date  . Gall stone removal    . Abdominal hysterectomy     History reviewed. No pertinent family history. History  Substance Use Topics  . Smoking status: Current Every Day Smoker  . Smokeless tobacco: Not on file  . Alcohol Use: Yes   OB History    No data available     Review of Systems  Constitutional: Negative for activity change.       All ROS Neg except as noted in HPI  HENT: Negative.   Eyes: Negative for photophobia and discharge.  Respiratory: Negative for cough,  shortness of breath and wheezing.   Cardiovascular: Negative for chest pain and palpitations.  Gastrointestinal: Negative for abdominal pain and blood in stool.  Genitourinary: Positive for hematuria. Negative for dysuria and frequency.  Musculoskeletal: Negative for back pain, arthralgias and neck pain.  Skin: Negative.   Neurological: Negative for dizziness, seizures and speech difficulty.  Psychiatric/Behavioral: Negative for hallucinations and confusion.      Allergies  Review of patient's allergies indicates no known allergies.  Home Medications   Prior to Admission medications   Medication Sig Start Date End Date Taking? Authorizing Provider  cephALEXin (KEFLEX) 500 MG capsule Take 1 capsule (500 mg total) by mouth 4 (four) times daily. 12/02/14   Lenox Ahr, PA-C  HYDROcodone-acetaminophen (NORCO/VICODIN) 5-325 MG per tablet Take 1 tablet by mouth every 4 (four) hours as needed. 12/02/14   Lenox Ahr, PA-C  insulin NPH-regular (NOVOLIN 70/30) (70-30) 100 UNIT/ML injection Inject 15 Units into the skin 2 (two) times daily with a meal.    Historical Provider, MD   BP 138/80 mmHg  Pulse 105  Temp(Src) 98.8 F (37.1 C) (Oral)  Resp 18  Ht 5\' 7"  (1.702 m)  Wt 176 lb (79.833 kg)  BMI 27.56 kg/m2  SpO2 100% Physical Exam  Constitutional: She is oriented to person, place, and time. She appears well-developed and well-nourished.  Non-toxic appearance.  HENT:  Head: Normocephalic.  Right Ear: Tympanic membrane and external ear  normal.  Left Ear: Tympanic membrane and external ear normal.  Eyes: EOM and lids are normal. Pupils are equal, round, and reactive to light.  Neck: Normal range of motion. Neck supple. Carotid bruit is not present.  Cardiovascular: Normal rate, regular rhythm, normal heart sounds, intact distal pulses and normal pulses.   Pulmonary/Chest: Breath sounds normal. No respiratory distress.  Abdominal: Soft. Bowel sounds are normal. There is  tenderness. There is no guarding.  Mild tenderness just over the suprapubic/bladder area. No other tenderness appreciated. No mass appreciated. No CVA tenderness.  Musculoskeletal: Normal range of motion.  Lymphadenopathy:       Head (right side): No submandibular adenopathy present.       Head (left side): No submandibular adenopathy present.    She has no cervical adenopathy.  Neurological: She is alert and oriented to person, place, and time. She has normal strength. No cranial nerve deficit or sensory deficit.  Skin: Skin is warm and dry.  Psychiatric: She has a normal mood and affect. Her speech is normal.  Nursing note and vitals reviewed.   ED Course  Procedures (including critical care time) Labs Review Labs Reviewed  URINALYSIS, ROUTINE W REFLEX MICROSCOPIC - Abnormal; Notable for the following:    APPearance HAZY (*)    Specific Gravity, Urine >1.030 (*)    Hgb urine dipstick LARGE (*)    Protein, ur 100 (*)    Leukocytes, UA SMALL (*)    All other components within normal limits  URINE MICROSCOPIC-ADD ON - Abnormal; Notable for the following:    Squamous Epithelial / LPF FEW (*)    Bacteria, UA FEW (*)    All other components within normal limits  URINE CULTURE    Imaging Review No results found.   EKG Interpretation None      MDM Vital signs within normal limits, with exception of the pulse rate being elevated at 105. Urine analysis reveals a urinary tract infection. Culture has been sent to the lab.  The patient will be started on Keflex 500 mg 4 times a day. Patient is to have her urine rechecked in 7-10 days. I have given her instructions to return if any signs of increasing infection.   Final diagnoses:  UTI (lower urinary tract infection)    *I have reviewed nursing notes, vital signs, and all appropriate lab and imaging results for this patient.    Lenox Ahr, PA-C 12/02/14 [place], MD 12/04/14 [phone]

## 2014-12-02 NOTE — ED Notes (Signed)
Called for triage times one, no answer. Unable to locate patient.

## 2014-12-02 NOTE — ED Notes (Signed)
Pt c/o of frequent painful urination with blood, bright red. Denies all pertinent history.

## 2014-12-02 NOTE — Discharge Instructions (Signed)
Your test is consistent with a urinary tract infection. Please increase water and juices. Please use Keflex with breakfast, lunch, dinner, and at bedtime until all taken. Please use Azo for urinary pain (over-the-counter). Please see your primary physician, or the health department for recheck of your urine in 7-10 days. Urinary Tract Infection A urinary tract infection (UTI) can occur any place along the urinary tract. The tract includes the kidneys, ureters, bladder, and urethra. A type of germ called bacteria often causes a UTI. UTIs are often helped with antibiotic medicine.  HOME CARE   If given, take antibiotics as told by your doctor. Finish them even if you start to feel better.  Drink enough fluids to keep your pee (urine) clear or pale yellow.  Avoid tea, drinks with caffeine, and bubbly (carbonated) drinks.  Pee often. Avoid holding your pee in for a long time.  Pee before and after having sex (intercourse).  Wipe from front to back after you poop (bowel movement) if you are a woman. Use each tissue only once. GET HELP RIGHT AWAY IF:   You have back pain.  You have lower belly (abdominal) pain.  You have chills.  You feel sick to your stomach (nauseous).  You throw up (vomit).  Your burning or discomfort with peeing does not go away.  You have a fever.  Your symptoms are not better in 3 days. MAKE SURE YOU:   Understand these instructions.  Will watch your condition.  Will get help right away if you are not doing well or get worse. Document Released: 04/09/2008 Document Revised: 07/16/2012 Document Reviewed: 05/22/2012 [facility] Patient Information 2015 [place], [place]. This information is not intended to replace advice given to you by your health care provider. Make sure you discuss any questions you have with your health care provider.

## 2014-12-04 LAB — URINE CULTURE
CULTURE: NO GROWTH
Colony Count: NO GROWTH
Special Requests: NORMAL

## 2015-05-17 ENCOUNTER — Emergency Department (HOSPITAL_COMMUNITY)
Admission: EM | Admit: 2015-05-17 | Discharge: 2015-05-17 | Disposition: A | Discharge status: Home / Self Care | Payer: Self-pay | Attending: Emergency Medicine | Admitting: Emergency Medicine

## 2015-05-17 ENCOUNTER — Emergency Department (HOSPITAL_COMMUNITY): Payer: Self-pay

## 2015-05-17 ENCOUNTER — Encounter (HOSPITAL_COMMUNITY): Payer: Self-pay | Admitting: Emergency Medicine

## 2015-05-17 DIAGNOSIS — Y998 Other external cause status: Secondary | ICD-10-CM | POA: Insufficient documentation

## 2015-05-17 DIAGNOSIS — E119 Type 2 diabetes mellitus without complications: Secondary | ICD-10-CM | POA: Insufficient documentation

## 2015-05-17 DIAGNOSIS — S2232XA Fracture of one rib, left side, initial encounter for closed fracture: Secondary | ICD-10-CM

## 2015-05-17 DIAGNOSIS — Z90710 Acquired absence of both cervix and uterus: Secondary | ICD-10-CM | POA: Insufficient documentation

## 2015-05-17 DIAGNOSIS — E780 Pure hypercholesterolemia: Secondary | ICD-10-CM | POA: Insufficient documentation

## 2015-05-17 DIAGNOSIS — S3991XA Unspecified injury of abdomen, initial encounter: Secondary | ICD-10-CM | POA: Insufficient documentation

## 2015-05-17 DIAGNOSIS — Y9389 Activity, other specified: Secondary | ICD-10-CM | POA: Insufficient documentation

## 2015-05-17 DIAGNOSIS — W07XXXA Fall from chair, initial encounter: Secondary | ICD-10-CM | POA: Insufficient documentation

## 2015-05-17 DIAGNOSIS — S2242XA Multiple fractures of ribs, left side, initial encounter for closed fracture: Secondary | ICD-10-CM | POA: Insufficient documentation

## 2015-05-17 DIAGNOSIS — I10 Essential (primary) hypertension: Secondary | ICD-10-CM | POA: Insufficient documentation

## 2015-05-17 DIAGNOSIS — Z79899 Other long term (current) drug therapy: Secondary | ICD-10-CM | POA: Insufficient documentation

## 2015-05-17 DIAGNOSIS — Y9289 Other specified places as the place of occurrence of the external cause: Secondary | ICD-10-CM | POA: Insufficient documentation

## 2015-05-17 DIAGNOSIS — Z720 Tobacco use: Secondary | ICD-10-CM | POA: Insufficient documentation

## 2015-05-17 LAB — I-STAT CHEM 8, ED
BUN: 22 mg/dL — AB (ref 6–20)
CALCIUM ION: 1.24 mmol/L — AB (ref 1.12–1.23)
CHLORIDE: 106 mmol/L (ref 101–111)
Creatinine, Ser: 0.9 mg/dL (ref 0.44–1.00)
GLUCOSE: 128 mg/dL — AB (ref 65–99)
HCT: 39 % (ref 36.0–46.0)
HEMOGLOBIN: 13.3 g/dL (ref 12.0–15.0)
Potassium: 4.3 mmol/L (ref 3.5–5.1)
Sodium: 141 mmol/L (ref 135–145)
TCO2: 22 mmol/L (ref 0–100)

## 2015-05-17 MED ORDER — HYDROCODONE-ACETAMINOPHEN 5-325 MG PO TABS
1.0000 | ORAL_TABLET | Freq: Once | ORAL | Status: AC
  Administered 2015-05-17: 1 via ORAL
  Filled 2015-05-17: qty 1

## 2015-05-17 MED ORDER — IOHEXOL 300 MG/ML  SOLN
100.0000 mL | Freq: Once | INTRAMUSCULAR | Status: AC | PRN
  Administered 2015-05-17: 100 mL via INTRAVENOUS

## 2015-05-17 MED ORDER — HYDROCODONE-ACETAMINOPHEN 5-325 MG PO TABS: 1.0000 | ORAL_TABLET | ORAL | Status: DC | PRN

## 2015-05-17 NOTE — ED Notes (Signed)
Notified respiratory for incentive spirometer.

## 2015-05-17 NOTE — Discharge Instructions (Signed)
Rib Fracture °A rib fracture is a break or crack in one of the bones of the ribs. The ribs are a group of long, curved bones that wrap around your chest and attach to your spine. They protect your lungs and other organs in the chest cavity. A broken or cracked rib is often painful, but most do not cause other problems. Most rib fractures heal on their own over time. However, rib fractures can be more serious if multiple ribs are broken or if broken ribs move out of place and push against other structures. °CAUSES  °· A direct blow to the chest. For example, this could happen during contact sports, a car accident, or a fall against a hard object. °· Repetitive movements with high force, such as pitching a baseball or having severe coughing spells. °SYMPTOMS  °· Pain when you breathe in or cough. °· Pain when someone presses on the injured area. °DIAGNOSIS  °Your caregiver will perform a physical exam. Various imaging tests may be ordered to confirm the diagnosis and to look for related injuries. These tests may include a chest X-ray, computed tomography (CT), magnetic resonance imaging (MRI), or a bone scan. °TREATMENT  °Rib fractures usually heal on their own in 1-3 months. The longer healing period is often associated with a continued cough or other aggravating activities. During the healing period, pain control is very important. Medication is usually given to control pain. Hospitalization or surgery may be needed for more severe injuries, such as those in which multiple ribs are broken or the ribs have moved out of place.  °HOME CARE INSTRUCTIONS  °· Avoid strenuous activity and any activities or movements that cause pain. Be careful during activities and avoid bumping the injured rib. °· Gradually increase activity as directed by your caregiver. °· Only take over-the-counter or prescription medications as directed by your caregiver. Do not take other medications without asking your caregiver first. °· Apply ice  to the injured area for the first 1-2 days after you have been treated or as directed by your caregiver. Applying ice helps to reduce inflammation and pain. °¨ Put ice in a plastic bag. °¨ Place a towel between your skin and the bag.   °¨ Leave the ice on for 15-20 minutes at a time, every 2 hours while you are awake. °· Perform deep breathing as directed by your caregiver. This will help prevent pneumonia, which is a common complication of a broken rib. Your caregiver may instruct you to: °¨ Take deep breaths several times a day. °¨ Try to cough several times a day, holding a pillow against the injured area. °¨ Use a device called an incentive spirometer to practice deep breathing several times a day. °· Drink enough fluids to keep your urine clear or pale yellow. This will help you avoid constipation.   °· Do not wear a rib belt or binder. These restrict breathing, which can lead to pneumonia.   °SEEK IMMEDIATE MEDICAL CARE IF:  °· You have a fever.   °· You have difficulty breathing or shortness of breath.   °· You develop a continual cough, or you cough up thick or bloody sputum. °· You feel sick to your stomach (nausea), throw up (vomit), or have abdominal pain.   °· You have worsening pain not controlled with medications.   °MAKE SURE YOU: °· Understand these instructions. °· Will watch your condition. °· Will get help right away if you are not doing well or get worse. °Document Released: 10/22/2005 Document Revised: 06/24/2013 Document Reviewed:   12/24/2012 [employer] Patient Information 2015 [place], [place]. This information is not intended to replace advice given to you by your health care provider. Make sure you discuss any questions you have with your health care provider.   You may take the hydrocodone prescribed for pain relief.  This will make you drowsy - do not drive within 4 hours of taking this medication.  You may also take ibuprofen for pain relief.

## 2015-05-17 NOTE — ED Notes (Signed)
Pt c/o left rib pain after tripping and falling onto wooden rocking chair last night.

## 2015-05-19 NOTE — ED Provider Notes (Signed)
CSN: [id number]     Arrival date & time 05/17/15  8469 History   First MD Initiated Contact with Patient 05/17/15 [phone]     Chief Complaint  Patient presents with  . Fall     (Consider location/radiation/quality/duration/timing/severity/associated sxs/prior Treatment) The history is provided by the patient.   Grace Thompson is a 59 y.o. female presenting with pain in her left ribcage after tripping last night and landing against the armrest of her rocking chair. She denies other injury or pain including no head, neck or back pain.  She denies sob, but endorses pain with deep inspiration and also with palpation and certain movements. She has had no nausea, vomiting,  Abdominal pain, dizziness.   She has taken tylenol without relief of pain.     Past Medical History  Diagnosis Date  . Diabetes mellitus without complication   . Hypertension   . High cholesterol    Past Surgical History  Procedure Laterality Date  . Gall stone removal    . Abdominal hysterectomy     No family history on file. History  Substance Use Topics  . Smoking status: Current Every Day Smoker  . Smokeless tobacco: Not on file  . Alcohol Use: Yes   OB History    No data available     Review of Systems  Constitutional: Negative for fever.  HENT: Negative for congestion and sore throat.   Eyes: Negative.   Respiratory: Negative for cough, chest tightness and shortness of breath.   Cardiovascular: Positive for chest pain.  Gastrointestinal: Negative for nausea, vomiting and abdominal pain.  Genitourinary: Negative.   Musculoskeletal: Negative for back pain, joint swelling, arthralgias and neck pain.  Skin: Negative.  Negative for rash and wound.  Neurological: Negative for dizziness, weakness, light-headedness, numbness and headaches.  Psychiatric/Behavioral: Negative.       Allergies  Review of patient's allergies indicates no known allergies.  Home Medications   Prior to Admission  medications   Medication Sig Start Date End Date Taking? Authorizing Provider  acetaminophen (TYLENOL) 500 MG tablet Take 1,000 mg by mouth every 6 (six) hours as needed for moderate pain or headache.   Yes Historical Provider, MD  celecoxib (CELEBREX) 100 MG capsule Take 100 mg by mouth daily as needed (knee swelling/pain).   Yes Historical Provider, MD  Ezetimibe-Simvastatin (VYTORIN PO) Take 1 tablet by mouth daily.   Yes Historical Provider, MD  Fish Oil-Cholecalciferol (FISH OIL + D3 PO) Take 1 capsule by mouth daily.   Yes Historical Provider, MD  insulin NPH-regular (NOVOLIN 70/30) (70-30) 100 UNIT/ML injection Inject 10 Units into the skin 2 (two) times daily with a meal.    Yes Historical Provider, MD  lisinopril (PRINIVIL,ZESTRIL) 10 MG tablet Take 10 mg by mouth daily.   Yes Historical Provider, MD  metFORMIN (GLUCOPHAGE) 500 MG tablet Take 500 mg by mouth 2 (two) times daily with a meal.   Yes Historical Provider, MD  HYDROcodone-acetaminophen (NORCO/VICODIN) 5-325 MG per tablet Take 1 tablet by mouth every 4 (four) hours as needed. 05/17/15   Evalee Jefferson, PA-C   BP 159/91 mmHg  Pulse 71  Temp(Src) 98.1 F (36.7 C) (Oral)  Resp 20  Ht 5\' 7"  (1.702 m)  Wt 170 lb (77.111 kg)  BMI 26.62 kg/m2  SpO2 99% Physical Exam  Constitutional: She appears well-developed and well-nourished.  HENT:  Head: Normocephalic and atraumatic.  Eyes: Conjunctivae are normal.  Neck: Normal range of motion and full passive range of motion  without pain. Neck supple. No spinous process tenderness and no muscular tenderness present.  Cardiovascular: Normal rate, regular rhythm, normal heart sounds and intact distal pulses.   Pulmonary/Chest: Effort normal and breath sounds normal. She has no decreased breath sounds. She has no wheezes. She has no rhonchi. She has no rales.    No edema, hematoma, no step offs left lateral chest.  Abdominal: Soft. Bowel sounds are normal. There is no tenderness.    Musculoskeletal: Normal range of motion.  Neurological: She is alert.  Skin: Skin is warm and dry.  Psychiatric: She has a normal mood and affect.  Nursing note and vitals reviewed.   ED Course  Procedures (including critical care time) Labs Review Labs Reviewed  I-STAT CHEM 8, ED - Abnormal; Notable for the following:    BUN 22 (*)    Glucose, Bld 128 (*)    Calcium, Ion 1.24 (*)    All other components within normal limits    Imaging Review No results found.   EKG Interpretation None      MDM   Final diagnoses:  Rib fractures, left, closed, initial encounter    Patients labs and/or radiological studies were reviewed and considered during the medical decision making and disposition process.  Results were also discussed with patient. Pt was given an incentive spirometer, prescribed hydrocodone. Plan f/u with pcp in 1 week for a recheck of sx.  Advised to return here for fevers, chills, sob or worsened pain.  Pt was seen by Dr. Roderic Palau during this ed visit.    Evalee Jefferson, PA-C 05/19/15 2140  Milton Ferguson, MD 05/23/15 [phone]

## 2019-01-09 ENCOUNTER — Other Ambulatory Visit: Payer: Self-pay

## 2019-01-09 ENCOUNTER — Encounter (HOSPITAL_COMMUNITY): Payer: Self-pay | Admitting: *Deleted

## 2019-01-09 ENCOUNTER — Emergency Department (HOSPITAL_COMMUNITY)
Admission: EM | Admit: 2019-01-09 | Discharge: 2019-01-09 | Disposition: A | Discharge status: Home / Self Care | Payer: Self-pay | Attending: Emergency Medicine | Admitting: Emergency Medicine

## 2019-01-09 DIAGNOSIS — F17200 Nicotine dependence, unspecified, uncomplicated: Secondary | ICD-10-CM | POA: Insufficient documentation

## 2019-01-09 DIAGNOSIS — I10 Essential (primary) hypertension: Secondary | ICD-10-CM | POA: Insufficient documentation

## 2019-01-09 DIAGNOSIS — Z79899 Other long term (current) drug therapy: Secondary | ICD-10-CM | POA: Insufficient documentation

## 2019-01-09 DIAGNOSIS — L0211 Cutaneous abscess of neck: Secondary | ICD-10-CM | POA: Insufficient documentation

## 2019-01-09 DIAGNOSIS — L0291 Cutaneous abscess, unspecified: Secondary | ICD-10-CM

## 2019-01-09 DIAGNOSIS — Z7984 Long term (current) use of oral hypoglycemic drugs: Secondary | ICD-10-CM | POA: Insufficient documentation

## 2019-01-09 DIAGNOSIS — E119 Type 2 diabetes mellitus without complications: Secondary | ICD-10-CM | POA: Insufficient documentation

## 2019-01-09 MED ORDER — LIDOCAINE-EPINEPHRINE (PF) 2 %-1:200000 IJ SOLN
20.0000 mL | Freq: Once | INTRAMUSCULAR | Status: AC
  Administered 2019-01-09: 20 mL

## 2019-01-09 MED ORDER — HYDROCODONE-ACETAMINOPHEN 5-325 MG PO TABS
1.0000 | ORAL_TABLET | Freq: Once | ORAL | Status: AC
  Administered 2019-01-09: 1 via ORAL
  Filled 2019-01-09: qty 1

## 2019-01-09 MED ORDER — DOXYCYCLINE HYCLATE 100 MG PO CAPS: 100.0000 mg | ORAL_CAPSULE | Freq: Two times a day (BID) | ORAL | 0 refills | Status: AC

## 2019-01-09 NOTE — ED Triage Notes (Signed)
Abcess on the back of neck for "years" and has "flared up" one week ago. Patient confirms pain with discharge and increased swelling.

## 2019-01-09 NOTE — Discharge Instructions (Addendum)
Take antibiotics as prescribed.  Take the entire course, even if your symptoms improve. Use Tylenol and ibuprofen as needed for pain. Use a warm compress to help with pain and swelling. Follow-up with your primary care doctor on Monday for recheck of the wound. Return to the emergency room if you develop high fevers, difficulty moving your neck, severe worsening pain, or any new, worsening, or concerning symptoms.

## 2019-01-09 NOTE — ED Provider Notes (Signed)
[facility] EMERGENCY DEPARTMENT Provider Note   CSN: [id number] Arrival date & time: 01/09/19  1020    History   Chief Complaint Chief Complaint  Patient presents with  . Abscess    back of neck    HPI Grace Thompson is a 63 y.o. female presenting for evaluation of neck abscess.   Pt states she has had 2 cysts on her neck for several years. In the past 2 weeks, she has had more pain, and in the past week some purulent drainage. She has been taking tylenol with mild improvement of pain. She denies fevers, chills, neck stiffness, anterior neck pain, n/v. She has a h/o DM, BGL have been normal at home. Pain is constant, worse with palpation. Pain is described as a throbbing itch. No radiation.      HPI  Past Medical History:  Diagnosis Date  . Diabetes mellitus without complication ([place])   . High cholesterol   . Hypertension     There are no active problems to display for this patient.   Past Surgical History:  Procedure Laterality Date  . ABDOMINAL HYSTERECTOMY    . gall stone removal       OB History   No obstetric history on file.      Home Medications    Prior to Admission medications   Medication Sig Start Date End Date Taking? Authorizing Provider  acetaminophen (TYLENOL) 500 MG tablet Take 1,000 mg by mouth every 6 (six) hours as needed for moderate pain or headache.    [provider]  celecoxib (CELEBREX) 100 MG capsule Take 100 mg by mouth daily as needed (knee swelling/pain).    [provider]  doxycycline (VIBRAMYCIN) 100 MG capsule Take 1 capsule (100 mg total) by mouth 2 (two) times daily for 7 days. 01/09/19 01/16/19  Jalissa Heinzelman, PA-C  Ezetimibe-Simvastatin (VYTORIN PO) Take 1 tablet by mouth daily.    [provider]  Fish Oil-Cholecalciferol (FISH OIL + D3 PO) Take 1 capsule by mouth daily.    [provider]  HYDROcodone-acetaminophen (NORCO/VICODIN) 5-325 MG per tablet Take 1 tablet by mouth every 4  (four) hours as needed. 05/17/15   Idol, Almyra Free, PA-C  insulin NPH-regular (NOVOLIN 70/30) (70-30) 100 UNIT/ML injection Inject 10 Units into the skin 2 (two) times daily with a meal.     [provider]  lisinopril (PRINIVIL,ZESTRIL) 10 MG tablet Take 10 mg by mouth daily.    [provider]  metFORMIN (GLUCOPHAGE) 500 MG tablet Take 500 mg by mouth 2 (two) times daily with a meal.    [provider]    Family History No family history on file.  Social History Social History   Tobacco Use  . Smoking status: Current Every Day Smoker  . Smokeless tobacco: Never Used  Substance Use Topics  . Alcohol use: Yes  . Drug use: No     Allergies   Patient has no known allergies.   Review of Systems Review of Systems  Constitutional: Negative for fever.  Skin:       Abscess on neck     Physical Exam Updated Vital Signs BP (!) 144/89 (BP Location: Right Arm)   Pulse 89   Temp 98.9 F (37.2 C) (Oral)   Resp 14   Ht 5\' 7"  (1.702 m)   Wt 78 kg   SpO2 99%   BMI 26.94 kg/m   Physical Exam Vitals signs and nursing note reviewed.  Constitutional:  General: She is not in acute distress.    Appearance: She is well-developed.     Comments: Sitting comfortably in the bed in NAD  HENT:     Head: Normocephalic and atraumatic.  Neck:     Musculoskeletal: Normal range of motion and neck supple.      Comments: 2 lesions on neck. Pt with soft, non tender and fluctuant lesion consistent with cyst.  Underneath cyst, pt with indurated, tender, and erythematous lesion with a central head. Consistent with abscess. No active drainage.  Moving head easily. No signs of meningismus Pulmonary:     Effort: Pulmonary effort is normal.  Abdominal:     General: There is no distension.  Musculoskeletal: Normal range of motion.  Skin:    General: Skin is warm.     Findings: No rash.  Neurological:     Mental Status: She is alert and oriented to person, place, and  time.      ED Treatments / Results  Labs (all labs ordered are listed, but only abnormal results are displayed) Labs Reviewed - No data to display  EKG None  Radiology No results found.  Procedures .Marland KitchenIncision and Drainage Date/Time: 01/09/2019 1:29 PM Performed by: Franchot Heidelberg, PA-C Authorized by: Franchot Heidelberg, PA-C   Consent:    Consent obtained:  Verbal   Consent given by:  Patient   Risks discussed:  Bleeding, damage to other organs, incomplete drainage, infection and pain Location:    Type:  Abscess   Location:  Neck   Neck location: posterior. Pre-procedure details:    Skin preparation:  Betadine Anesthesia (see MAR for exact dosages):    Anesthesia method:  Local infiltration   Local anesthetic:  Lidocaine 1% w/o epi Procedure type:    Complexity:  Simple Procedure details:    Incision types:  Single straight   Incision depth:  Dermal   Scalpel blade:  11   Wound management:  Probed and deloculated and irrigated with saline   Drainage:  Purulent   Drainage amount:  Copious   Wound treatment:  Wound left open   Packing materials:  None Post-procedure details:    Patient tolerance of procedure:  Tolerated well, no immediate complications   (including critical care time)  Medications Ordered in ED Medications  lidocaine-EPINEPHrine (XYLOCAINE W/EPI) 2 %-1:200000 (PF) injection 20 mL (20 mLs Infiltration Given 01/09/19 1244)  HYDROcodone-acetaminophen (NORCO/VICODIN) 5-325 MG per tablet 1 tablet (1 tablet Oral Given 01/09/19 1244)     Initial Impression / Assessment and Plan / ED Course  I have reviewed the triage vital signs and the nursing notes.  Pertinent labs & imaging results that were available during my care of the patient were reviewed by me and considered in my medical decision making (see chart for details).        Pt presenting with neck abscess. History consistent with infected cyst. physical exam reassuring, she appears nontoxic,  has no fevers, and no signs of meningitis. abscess is superficial. I&D as described above. Pt tolerated well. Pt placed on abx consider DM history and location of abscess. Encourage close f/u woth PCP. Discussed wound care. At this time, pt appears safe for d/c. Return precautions given. Pt states she understands and agrees to plan.   Final Clinical Impressions(s) / ED Diagnoses   Final diagnoses:  Abscess    ED Discharge Orders         Ordered    doxycycline (VIBRAMYCIN) 100 MG capsule  2 times daily  01/09/19 [place], Quinlin Conant, PA-C 01/09/19 1331    Hayden Rasmussen, MD 01/10/19 1044

## 2021-03-10 ENCOUNTER — Telehealth: Payer: Self-pay

## 2021-03-10 NOTE — Telephone Encounter (Signed)
Received call from client that was [facility]. She received a Case Management welcome letter from [facility] and called to inquire about what was offered with our program. Discussed all the services with client including [place], food resources, case management, dental.  She reports understanding will plan check in calls with client for any further needs.   Debria Garret RN [employer]

## 2021-08-04 ENCOUNTER — Other Ambulatory Visit: Payer: Self-pay

## 2021-08-04 ENCOUNTER — Emergency Department (HOSPITAL_COMMUNITY)
Admission: EM | Admit: 2021-08-04 | Discharge: 2021-08-04 | Disposition: A | Discharge status: Home / Self Care | Payer: Self-pay | Attending: Emergency Medicine | Admitting: Emergency Medicine

## 2021-08-04 ENCOUNTER — Emergency Department (HOSPITAL_COMMUNITY): Payer: Self-pay

## 2021-08-04 ENCOUNTER — Encounter (HOSPITAL_COMMUNITY): Payer: Self-pay | Admitting: *Deleted

## 2021-08-04 DIAGNOSIS — R059 Cough, unspecified: Secondary | ICD-10-CM | POA: Insufficient documentation

## 2021-08-04 DIAGNOSIS — R3981 Functional urinary incontinence: Secondary | ICD-10-CM | POA: Insufficient documentation

## 2021-08-04 DIAGNOSIS — E119 Type 2 diabetes mellitus without complications: Secondary | ICD-10-CM | POA: Insufficient documentation

## 2021-08-04 DIAGNOSIS — M4316 Spondylolisthesis, lumbar region: Secondary | ICD-10-CM

## 2021-08-04 DIAGNOSIS — Z794 Long term (current) use of insulin: Secondary | ICD-10-CM | POA: Insufficient documentation

## 2021-08-04 DIAGNOSIS — M5431 Sciatica, right side: Secondary | ICD-10-CM | POA: Insufficient documentation

## 2021-08-04 DIAGNOSIS — Z7984 Long term (current) use of oral hypoglycemic drugs: Secondary | ICD-10-CM | POA: Insufficient documentation

## 2021-08-04 DIAGNOSIS — M5432 Sciatica, left side: Secondary | ICD-10-CM | POA: Insufficient documentation

## 2021-08-04 DIAGNOSIS — I10 Essential (primary) hypertension: Secondary | ICD-10-CM | POA: Insufficient documentation

## 2021-08-04 DIAGNOSIS — F17210 Nicotine dependence, cigarettes, uncomplicated: Secondary | ICD-10-CM | POA: Insufficient documentation

## 2021-08-04 DIAGNOSIS — Z79899 Other long term (current) drug therapy: Secondary | ICD-10-CM | POA: Insufficient documentation

## 2021-08-04 DIAGNOSIS — R200 Anesthesia of skin: Secondary | ICD-10-CM | POA: Insufficient documentation

## 2021-08-04 LAB — URINALYSIS, ROUTINE W REFLEX MICROSCOPIC
Bilirubin Urine: NEGATIVE
Glucose, UA: NEGATIVE mg/dL
Hgb urine dipstick: NEGATIVE
Ketones, ur: 5 mg/dL — AB
Leukocytes,Ua: NEGATIVE
Nitrite: NEGATIVE
Protein, ur: 30 mg/dL — AB
Specific Gravity, Urine: 1.032 — ABNORMAL HIGH (ref 1.005–1.030)
pH: 5 (ref 5.0–8.0)

## 2021-08-04 MED ORDER — ACETAMINOPHEN 325 MG PO TABS: 650.0000 mg | ORAL_TABLET | Freq: Four times a day (QID) | ORAL | 0 refills | Status: AC | PRN

## 2021-08-04 MED ORDER — LIDOCAINE 5 % EX PTCH: 1.0000 | MEDICATED_PATCH | CUTANEOUS | 0 refills | Status: AC

## 2021-08-04 MED ORDER — IBUPROFEN 400 MG PO TABS
600.0000 mg | ORAL_TABLET | Freq: Once | ORAL | Status: AC
  Administered 2021-08-04: 600 mg via ORAL
  Filled 2021-08-04: qty 2

## 2021-08-04 NOTE — ED Triage Notes (Signed)
Pt c/o bilateral leg pain and numbness after a fall 3-4 months ago. Pt reports she fell straight down onto her buttocks. Denies hitting her head or use of blood thinners.

## 2021-08-04 NOTE — ED Provider Notes (Signed)
[facility] Provider Note   CSN: [id number] Arrival date & time: 08/04/21  1002     History No chief complaint on file.   Grace Thompson is a 65 y.o. female with a past medical history of type 2 diabetes and hypertension presents today with a complaint of a fall that occurred 4-5 months ago.  Patient reports that she slipped down some stairs and fell onto her lower back and bottom while mopping 4 months ago.  She decided to come in today because she has had increased pain and wants to take care of herself now that she has no longer caretaking her elderly mother.  Rates the pain 9 out of 10.  It is slightly alleviated with Excedrin and Celebrex.  Worsened with movement.  Currently utilizing mother's walker to ambulate.  Reports that the pain is bilateral and radiates down both of her legs and she feels as though she has associated numbness and tingling.  Denies any active cancer or previous diagnosis.  Endorses urinary incontinence over the past few months associated with coughing and quick movements.  Has not had bowel movement in 4 days however previously with 1 bowel movement every other day.  Does have diabetes controlled on metformin and insulin.  Reports stable blood sugars at home.  Denies IVDU.  Smokes marijuana and cigarettes. Saw her primary care provider yesterday who wanted her to obtain imaging.     Past Medical History:  Diagnosis Date   Diabetes mellitus without complication (HCC)    High cholesterol    Hypertension     There are no problems to display for this patient.   Past Surgical History:  Procedure Laterality Date   ABDOMINAL HYSTERECTOMY     gall stone removal       OB History   No obstetric history on file.     No family history on file.  Social History   Tobacco Use   Smoking status: Every Day   Smokeless tobacco: Never  Substance Use Topics   Alcohol use: Yes   Drug use: No    Home Medications Prior to Admission  medications   Medication Sig Start Date End Date Taking? Authorizing Provider  acetaminophen (TYLENOL) 500 MG tablet Take 1,000 mg by mouth every 6 (six) hours as needed for moderate pain or headache.    [provider]  celecoxib (CELEBREX) 100 MG capsule Take 100 mg by mouth daily as needed (knee swelling/pain).    [provider]  Ezetimibe-Simvastatin (VYTORIN PO) Take 1 tablet by mouth daily.    [provider]  Fish Oil-Cholecalciferol (FISH OIL + D3 PO) Take 1 capsule by mouth daily.    [provider]  HYDROcodone-acetaminophen (NORCO/VICODIN) 5-325 MG per tablet Take 1 tablet by mouth every 4 (four) hours as needed. 05/17/15   Idol, Almyra Free, PA-C  insulin NPH-regular (NOVOLIN 70/30) (70-30) 100 UNIT/ML injection Inject 10 Units into the skin 2 (two) times daily with a meal.     [provider]  lisinopril (PRINIVIL,ZESTRIL) 10 MG tablet Take 10 mg by mouth daily.    [provider]  metFORMIN (GLUCOPHAGE) 500 MG tablet Take 500 mg by mouth 2 (two) times daily with a meal.    [provider]    Allergies    Patient has no known allergies.  Review of Systems   Review of Systems  Constitutional:  Negative for chills and fever.  Gastrointestinal:  Positive for constipation. Negative for diarrhea, nausea and  vomiting.  Genitourinary:  Negative for dysuria, flank pain and hematuria.  Musculoskeletal:  Positive for gait problem.  Neurological:  Positive for numbness. Negative for weakness.  All other systems reviewed and are negative.  Physical Exam Updated Vital Signs BP (!) 173/94 (BP Location: Right Arm)   Pulse 90   Temp 98 F (36.7 C) (Oral)   Resp 18   Ht 5\' 7"  (1.702 m)   Wt 76.2 kg   SpO2 99%   BMI 26.31 kg/m   Physical Exam Vitals and nursing note reviewed.  Constitutional:      General: She is not in acute distress.    Appearance: Normal appearance.  HENT:     Head: Normocephalic and atraumatic.  Eyes:      General: No scleral icterus.    Conjunctiva/sclera: Conjunctivae normal.  Cardiovascular:     Pulses: Normal pulses.  Pulmonary:     Effort: Pulmonary effort is normal. No respiratory distress.  Abdominal:     Tenderness: There is no abdominal tenderness. There is no guarding.  Musculoskeletal:        General: Tenderness (lumbar spine) present. No swelling or deformity.     Cervical back: Normal range of motion.     Right lower leg: No edema.     Left lower leg: No edema.  Skin:    General: Skin is warm and dry.     Capillary Refill: Capillary refill takes less than 2 seconds.     Findings: No rash.  Neurological:     Mental Status: She is alert.     Motor: No weakness.     Coordination: Coordination normal.     Gait: Gait normal.  Psychiatric:        Mood and Affect: Mood normal.    ED Results / Procedures / Treatments   Labs (all labs ordered are listed, but only abnormal results are displayed) Labs Reviewed  URINALYSIS, ROUTINE W REFLEX MICROSCOPIC - Abnormal; Notable for the following components:      Result Value   Color, Urine AMBER (*)    APPearance HAZY (*)    Specific Gravity, Urine 1.032 (*)    Ketones, ur 5 (*)    Protein, ur 30 (*)    Bacteria, UA RARE (*)    All other components within normal limits    EKG None  Radiology DG Lumbar Spine Complete  Result Date: 08/04/2021 CLINICAL DATA:  Fall EXAM: LUMBAR SPINE - COMPLETE 4+ VIEW COMPARISON:  None. FINDINGS: There is 1.3 cm anterolisthesis at L4-L5. Trace retrolisthesis L5-S1. Vertebral body heights are maintained. Multilevel disc space narrowing, greatest at L4-L5. No definite pars break identified. IMPRESSION: No acute abnormality identified. Grade 2 anterolisthesis L4-L5. Multilevel degenerative changes, greatest at L4-L5. Electronically Signed   By: Macy Mis M.D.   On: 08/04/2021 11:57    Procedures Procedures   Medications Ordered in ED Medications  ibuprofen (ADVIL) tablet 600 mg (600  mg Oral Given 08/04/21 1208)    ED Course  I have reviewed the triage vital signs and the nursing notes.  Pertinent labs & imaging results that were available during my care of the patient were reviewed by me and considered in my medical decision making (see chart for details).    MDM Rules/Calculators/A&P  Grace Thompson is a 65 y.o. female with a past medical history of type 2 diabetes and hypertension presents today with a complaint of a fall that occurred 4-5 months ago.  Patient reports that she slipped  down some stairs and fell onto her lower back and bottom while mopping 4 months ago.  She decided to come in today because she has had increased back pain and wants to take care of herself now that she has no longer caretaking her elderly mother.  Rates the pain 9 out of 10.    The differential diagnosis for back pain includes but is not limited to fracture, muscle strain, cauda equina, spinal stenosis. DDD, disk herniation, spondylolisthesis, metastatic cancer, transverse myelitis, vertebral osteomyelitis, diskitis, kidney stone, pyelonephritis, AAA, perforated ulcer, Retrocecal appendicitis, pancreatitis, bowel obstruction, retroperitoneal hemorrhage or mass, meningitis all of these were considered throughout my evaluation of the patient.  Patient was evaluated by me at bedside.  It is noted that she ambulated into the department and patient reports that she walked to the emergency department from her home 1/4 mile away.  Her symptoms are consistent with a lumbosacral radiculopathy with sciatica.  I have no concern for cauda equina as her urinary incontinence appears to be stress related.  Patient also with chronic symptoms over the past few months with no acute injury. XR of lumbar spine shows Grade 2 anterolisthesis L4-L5. Multilevel degenerative changes, greatest at L4-L5. Urinalysis did not reveal infection.  Some proteinuria and ketonuria. I discussed these results with the patient and she  understands the need to follow-up with primary care and/or the sports medicine office attached to her discharge papers.  Agreeable and stable for discharge with medication and discussed return precautions.   Final Clinical Impression(s) / ED Diagnoses Final diagnoses:  Bilateral sciatica    Rx / DC Orders Results and diagnoses were explained to the patient. Return precautions discussed in full. Patient had no additional questions and expressed complete understanding.     Darliss Ridgel 08/04/21 1342    Isla Pence, MD 08/04/21 [id number]

## 2021-08-04 NOTE — Discharge Instructions (Addendum)
You do not have any broken bones in your back at this time.  You do have 2 vertebrae that have moved forward in your spine.  They may be pushing on some nerves and causing your pain and numbness.  I want you to follow-up with your primary care provider to discuss treatment and potential at home exercises.  I am also attaching a sports medicine office for you to call for physical therapy if you all see fit.  Please take the Tylenol I am sending you as prescribed.  Also remember that you must have at least 12 hours a day without a lidocaine patch on.  It was a pleasure to meet you and I hope that you feel better.

## 2022-04-09 ENCOUNTER — Other Ambulatory Visit: Payer: Self-pay | Admitting: *Deleted

## 2022-04-09 DIAGNOSIS — R221 Localized swelling, mass and lump, neck: Secondary | ICD-10-CM

## 2022-04-10 ENCOUNTER — Ambulatory Visit: Payer: Medicare Other | Admitting: General Surgery

## 2022-05-17 ENCOUNTER — Encounter: Payer: Self-pay | Admitting: General Surgery

## 2022-05-17 ENCOUNTER — Ambulatory Visit (INDEPENDENT_AMBULATORY_CARE_PROVIDER_SITE_OTHER): Payer: Medicare Other | Admitting: General Surgery

## 2022-05-17 ENCOUNTER — Ambulatory Visit: Payer: Medicare Other | Admitting: General Surgery

## 2022-05-17 VITALS — BP 118/77 | HR 83 | Temp 98.0°F | Resp 12 | Ht 67.0 in | Wt 162.0 lb

## 2022-05-17 DIAGNOSIS — R221 Localized swelling, mass and lump, neck: Secondary | ICD-10-CM | POA: Diagnosis not present

## 2022-05-17 DIAGNOSIS — D367 Benign neoplasm of other specified sites: Secondary | ICD-10-CM | POA: Diagnosis not present

## 2022-05-17 NOTE — Progress Notes (Signed)
[facility] History and Physical  Reason for Referral: Neck mass  Referring Physician:  Denyce Robert, FNP   Chief Complaint   New Patient (Initial Visit)     Grace Thompson is a 66 y.o. female.  HPI: Grace Thompson is a 66 yo who comes in with a cyst on her back of her neck she has had for years. She has two cyst and had one I&D in the Ed a few years ago. She denies any recent infections. She says that she wants to go ahead and get these removed. She report that she would not even notice they were there if it was not for them being so large.   She says she is going to need some sedation for her excision and does not think local will be enough.   Past Medical History:  Diagnosis Date   Diabetes mellitus without complication (HCC)    High cholesterol    Hypertension     Past Surgical History:  Procedure Laterality Date   ABDOMINAL HYSTERECTOMY     gall stone removal      History reviewed. No pertinent family history.  Social History   Tobacco Use   Smoking status: Every Day    Packs/day: 1.00    Types: Cigarettes   Smokeless tobacco: Never  Vaping Use   Vaping Use: Never used  Substance Use Topics   Alcohol use: Yes   Drug use: Yes    Types: Marijuana    Medications: I have reviewed the patient's current medications. Allergies as of 05/17/2022   No Known Allergies      Medication List        Accurate as of May 17, 2022 11:59 PM. If you have any questions, ask your nurse or doctor.          STOP taking these medications    VYTORIN PO Stopped by: Virl Cagey, MD       TAKE these medications    amLODipine 5 MG tablet Commonly known as: NORVASC Take 5 mg by mouth daily.   celecoxib 200 MG capsule Commonly known as: CELEBREX Take 200 mg by mouth 2 (two) times daily.   ezetimibe 10 MG tablet Commonly known as: ZETIA SMARTSIG:1 Tablet(s) By Mouth Every Evening   FISH OIL + D3 PO Take 1 capsule by mouth daily.    gabapentin 100 MG capsule Commonly known as: NEURONTIN Take 100 mg by mouth.   HYDROcodone-acetaminophen 5-325 MG tablet Commonly known as: NORCO/VICODIN Take 1 tablet by mouth every 4 (four) hours as needed.   insulin NPH-regular Human (70-30) 100 UNIT/ML injection Inject 10 Units into the skin 2 (two) times daily with a meal.   lisinopril 10 MG tablet Commonly known as: ZESTRIL Take 10 mg by mouth daily.   metFORMIN 500 MG tablet Commonly known as: GLUCOPHAGE Take 500 mg by mouth 2 (two) times daily with a meal.         ROS:  A comprehensive review of systems was negative except for: Genitourinary: positive for frequency Musculoskeletal: positive for knee pain  Blood pressure 118/77, pulse 83, temperature 98 F (36.7 C), temperature source Oral, resp. rate 12, height '5\' 7"'$  (1.702 m), weight 162 lb (73.5 kg), SpO2 97 %. Physical Exam Constitutional:      Appearance: Normal appearance.  HENT:     Head: Normocephalic.     Nose: Nose normal.     Mouth/Throat:     Mouth: Mucous membranes are moist.  Eyes:  Extraocular Movements: Extraocular movements intact.  Neck:     Comments: Posterior lower neck with 2cm superficial mass and lower 0.5cm mass  Cardiovascular:     Rate and Rhythm: Normal rate and regular rhythm.  Pulmonary:     Effort: Pulmonary effort is normal.     Breath sounds: Normal breath sounds.  Abdominal:     General: There is no distension.     Palpations: Abdomen is soft.     Tenderness: There is no abdominal tenderness.  Musculoskeletal:        General: Normal range of motion.  Skin:    General: Skin is warm.  Neurological:     General: No focal deficit present.     Mental Status: She is alert.  Psychiatric:        Mood and Affect: Mood normal.        Thought Content: Thought content normal.        Judgment: Judgment normal.     Results: None  Assessment & Plan:  Grace Thompson is a 65 y.o. female with cyst on the posterior neck  that had to be drained in the past. She has some scarring.  Discussed excision with sedation and risk of bleeding, infection, and recurrence. Discussed sutures that will stay in for 2 weeks.   All questions were answered to the satisfaction of the patient.     Virl Cagey 05/18/2022, 12:46 PM

## 2022-05-17 NOTE — Patient Instructions (Signed)
Epidermoid Cyst  An epidermoid cyst, also known as epidermal cyst, is a sac made of skin tissue. The sac contains a substance called keratin. Keratin is a protein that is normally secreted through the hair follicles. When keratin becomes trapped in the top layer of skin (epidermis), it can form an epidermoid cyst. Epidermoid cysts can be found anywhere on your body. These cysts are usually harmless (benign), and they may not cause symptoms unless they become inflamed or infected. What are the causes? This condition may be caused by: A blocked hair follicle. A hair that curls and re-enters the skin instead of growing straight out of the skin (ingrown hair). A blocked pore. Irritated skin. An injury to the skin. Certain conditions that are passed along from parent to child (inherited). Human papillomavirus (HPV). This happens rarely when cysts occur on the bottom of the feet. Long-term (chronic) sun damage to the skin. What increases the risk? The following factors may make you more likely to develop an epidermoid cyst: Having acne. Being female. Having an injury to the skin. Being past puberty. Having certain rare genetic disorders. What are the signs or symptoms? The only symptom of this condition may be a small, painless lump underneath the skin. When an epidermal cyst ruptures, it may become inflamed. True infection in cysts is rare. Symptoms may include: Redness. Inflammation. Tenderness. Warmth. Keratin draining from the cyst. Keratin is grayish-white, bad-smelling substance. Pus draining from the cyst. How is this diagnosed? This condition is diagnosed with a physical exam. In some cases, you may have a sample of tissue (biopsy) taken from your cyst to be examined under a microscope or tested for bacteria. You may be referred to a health care provider who specializes in skin care (dermatologist). How is this treated? If a cyst becomes inflamed, treatment may include: Opening and  draining the cyst, done by a health care provider. After draining, minor surgery to remove the rest of the cyst may be done. Taking antibiotic medicine. Having injections of medicines (steroids) that help to reduce inflammation. Having surgery to remove the cyst. Surgery may be done if the cyst: Becomes large. Bothers you. Has a chance of turning into cancer. Do not try to open a cyst yourself. Follow these instructions at home: Medicines If you were prescribed an antibiotic medicine, take it it as told by your health care provider. Do not stop using the antibiotic even if you start to feel better. Take over-the-counter and prescription medicines only as told by your health care provider. General instructions Keep the area around your cyst clean and dry. Wear loose, dry clothing. Avoid touching your cyst. Check your cyst every day for signs of infection. Check for: Redness, swelling, or pain. Fluid or blood. Warmth. Pus or a bad smell. Keep all follow-up visits. This is important. How is this prevented? Wear clean, dry, clothing. Avoid wearing tight clothing. Keep your skin clean and dry. Take showers or baths every day. Contact a health care provider if: Your cyst develops symptoms of infection. Your condition is not improving or is getting worse. You develop a cyst that looks different from other cysts you have had. You have a fever. Get help right away if: Redness spreads from the cyst into the surrounding area. Summary An epidermoid cyst is a sac made of skin tissue. These cysts are usually harmless (benign), and they may not cause symptoms unless they become inflamed. If a cyst becomes inflamed, treatment may include surgery to open and drain the   cyst, or to remove it. Treatment may also include medicines by mouth or through an injection. Take over-the-counter and prescription medicines only as told by your health care provider. If you were prescribed an antibiotic medicine,  take it as told by your health care provider. Do not stop using the antibiotic even if you start to feel better. Contact a health care provider if your condition is not improving or is getting worse. Keep all follow-up visits as told by your health care provider. This is important. This information is not intended to replace advice given to you by your health care provider. Make sure you discuss any questions you have with your health care provider. Document Revised: 01/27/2020 Document Reviewed: 01/27/2020 Elsevier Patient Education  2023 [employer]  

## 2022-05-18 NOTE — H&P (Signed)
[facility] History and Physical  Reason for Referral: Neck mass  Referring Physician:  Denyce Robert, FNP   Chief Complaint   New Patient (Initial Visit)     Grace Thompson is a 66 y.o. female.  HPI: Grace Thompson is a 66 yo who comes in with a cyst on her back of her neck she has had for years. She has two cyst and had one I&D in the Ed a few years ago. She denies any recent infections. She says that she wants to go ahead and get these removed. She report that she would not even notice they were there if it was not for them being so large.   She says she is going to need some sedation for her excision and does not think local will be enough.   Past Medical History:  Diagnosis Date   Diabetes mellitus without complication (HCC)    High cholesterol    Hypertension     Past Surgical History:  Procedure Laterality Date   ABDOMINAL HYSTERECTOMY     gall stone removal      History reviewed. No pertinent family history.  Social History   Tobacco Use   Smoking status: Every Day    Packs/day: 1.00    Types: Cigarettes   Smokeless tobacco: Never  Vaping Use   Vaping Use: Never used  Substance Use Topics   Alcohol use: Yes   Drug use: Yes    Types: Marijuana    Medications: I have reviewed the patient's current medications. Allergies as of 05/17/2022   No Known Allergies      Medication List        Accurate as of May 17, 2022 11:59 PM. If you have any questions, ask your nurse or doctor.          STOP taking these medications    VYTORIN PO Stopped by: Virl Cagey, MD       TAKE these medications    amLODipine 5 MG tablet Commonly known as: NORVASC Take 5 mg by mouth daily.   celecoxib 200 MG capsule Commonly known as: CELEBREX Take 200 mg by mouth 2 (two) times daily.   ezetimibe 10 MG tablet Commonly known as: ZETIA SMARTSIG:1 Tablet(s) By Mouth Every Evening   FISH OIL + D3 PO Take 1 capsule by mouth daily.    gabapentin 100 MG capsule Commonly known as: NEURONTIN Take 100 mg by mouth.   HYDROcodone-acetaminophen 5-325 MG tablet Commonly known as: NORCO/VICODIN Take 1 tablet by mouth every 4 (four) hours as needed.   insulin NPH-regular Human (70-30) 100 UNIT/ML injection Inject 10 Units into the skin 2 (two) times daily with a meal.   lisinopril 10 MG tablet Commonly known as: ZESTRIL Take 10 mg by mouth daily.   metFORMIN 500 MG tablet Commonly known as: GLUCOPHAGE Take 500 mg by mouth 2 (two) times daily with a meal.         ROS:  A comprehensive review of systems was negative except for: Genitourinary: positive for frequency Musculoskeletal: positive for knee pain  Blood pressure 118/77, pulse 83, temperature 98 F (36.7 C), temperature source Oral, resp. rate 12, height '5\' 7"'$  (1.702 m), weight 162 lb (73.5 kg), SpO2 97 %. Physical Exam Constitutional:      Appearance: Normal appearance.  HENT:     Head: Normocephalic.     Nose: Nose normal.     Mouth/Throat:     Mouth: Mucous membranes are moist.  Eyes:  Extraocular Movements: Extraocular movements intact.  Neck:     Comments: Posterior lower neck with 2cm superficial mass and lower 0.5cm mass  Cardiovascular:     Rate and Rhythm: Normal rate and regular rhythm.  Pulmonary:     Effort: Pulmonary effort is normal.     Breath sounds: Normal breath sounds.  Abdominal:     General: There is no distension.     Palpations: Abdomen is soft.     Tenderness: There is no abdominal tenderness.  Musculoskeletal:        General: Normal range of motion.  Skin:    General: Skin is warm.  Neurological:     General: No focal deficit present.     Mental Status: She is alert.  Psychiatric:        Mood and Affect: Mood normal.        Thought Content: Thought content normal.        Judgment: Judgment normal.     Results: None  Assessment & Plan:  Grace Thompson is a 66 y.o. female with cyst on the posterior neck  that had to be drained in the past. She has some scarring.  Discussed excision with sedation and risk of bleeding, infection, and recurrence. Discussed sutures that will stay in for 2 weeks.   All questions were answered to the satisfaction of the patient.     Virl Cagey 05/18/2022, 12:46 PM

## 2022-05-30 NOTE — Patient Instructions (Signed)
Grace Thompson  05/30/2022     '@PREFPERIOPPHARMACY'$ @   Your procedure is scheduled on  06/06/2022.   Report to [facility] at  [phone]  A.M.   Call this number if you have problems the morning of surgery:  [phone]   Remember:  Do not eat or drink after midnight.     If you take your insulin at night, take 1/2 of your usual night time dose.     DO NOT take any medications for diabetes the morning of your procedure.        Take these medicines the morning of surgery with A SIP OF WATER          norvasc, celebrex, neurontin, ditropan.     Do not wear jewelry, make-up or nail polish.  Do not wear lotions, powders, or perfumes, or deodorant.  Do not shave 48 hours prior to surgery.  Men may shave face and neck.  Do not bring valuables to the hospital.  [facility] is not responsible for any belongings or valuables.  Contacts, dentures or bridgework may not be worn into surgery.  Leave your suitcase in the car.  After surgery it may be brought to your room.  For patients admitted to the hospital, discharge time will be determined by your treatment team.  Patients discharged the day of surgery will not be allowed to drive home and must have someone with them for 24 hours.    Special instructions:   DO NOT smoke tobacco or vape for 24 hours before your procedure.  Please read over the following fact sheets that you were given. Pain Booklet, Coughing and Deep Breathing, Surgical Site Infection Prevention, Anesthesia Post-op Instructions, and Care and Recovery After Surgery      Lipoma Removal, Care After The following information offers guidance on how to care for yourself after your procedure. Your health care provider may also give you more specific instructions. If you have problems or questions, contact your health care provider. What can I expect after the procedure? After the procedure, it is common to have: Mild pain. Swelling. Bruising. Follow these  instructions at home: Bathing  Do not take baths, swim, or use a hot tub until your health care provider approves. Ask your health care provider if you may take showers. You may only be allowed to take sponge baths. Keep your bandage (dressing) clean and dry until your health care provider says it can be removed. Incision care  Follow instructions from your health care provider about how to take care of your incision. Make sure you: Wash your hands with soap and water for at least 20 seconds before and after you change your dressing. If soap and water are not available, use hand sanitizer. Change your dressing as told by your health care provider. Leave stitches (sutures), skin glue, or adhesive strips in place. These skin closures may need to stay in place for 2 weeks or longer. If adhesive strip edges start to loosen and curl up, you may trim the loose edges. Do not remove adhesive strips completely unless your health care provider tells you to do that. Check your incision area every day for signs of infection. Check for: More redness, swelling, or pain. Fluid or blood. Warmth. Pus or a bad smell. Medicines Take over-the-counter and prescription medicines only as told by your health care provider. If you were prescribed an antibiotic medicine, use it as told by your health care provider.  Do not stop using the antibiotic even if you start to feel better. General instructions  If you were given a sedative during the procedure, it can affect you for several hours. Do not drive or operate machinery until your health care provider says that it is safe. Do not use any products that contain nicotine or tobacco before the procedure. These products include cigarettes, chewing tobacco, and vaping devices, such as e-cigarettes. These can delay healing after surgery. If you need help quitting, ask your health care provider. Return to your normal activities as told by your health care provider. Ask your  health care provider what activities are safe for you. Keep all follow-up visits. This is important. Contact a health care provider if: You have more redness, swelling, or pain around your incision. You have fluid or blood coming from your incision. Your incision feels warm to the touch. You have pus or a bad smell coming from your incision. You have pain that does not get better with medicine. Get help right away if: You have chills or a fever. You have severe pain. Summary After the procedure, it is common to have mild pain, swelling, and bruising. Follow instructions from your health care provider about how to take care of your incision. Contact a health care provider if you have signs of infection such as more redness, swelling, or pain. This information is not intended to replace advice given to you by your health care provider. Make sure you discuss any questions you have with your health care provider. Document Revised: 11/10/2021 Document Reviewed: 11/10/2021 Elsevier Patient Education  [place] Anesthesia, Adult, Care After This sheet gives you information about how to care for yourself after your procedure. Your health care provider may also give you more specific instructions. If you have problems or questions, contact your health care provider. What can I expect after the procedure? After the procedure, the following side effects are common: Pain or discomfort at the IV site. Nausea. Vomiting. Sore throat. Trouble concentrating. Feeling cold or chills. Feeling weak or tired. Sleepiness and fatigue. Soreness and body aches. These side effects can affect parts of the body that were not involved in surgery. Follow these instructions at home: For the time period you were told by your health care provider:  Rest. Do not participate in activities where you could fall or become injured. Do not drive or use machinery. Do not drink alcohol. Do not take  sleeping pills or medicines that cause drowsiness. Do not make important decisions or sign legal documents. Do not take care of children on your own. Eating and drinking Follow any instructions from your health care provider about eating or drinking restrictions. When you feel hungry, start by eating small amounts of foods that are soft and easy to digest (bland), such as toast. Gradually return to your regular diet. Drink enough fluid to keep your urine pale yellow. If you vomit, rehydrate by drinking water, juice, or clear broth. General instructions If you have sleep apnea, surgery and certain medicines can increase your risk for breathing problems. Follow instructions from your health care provider about wearing your sleep device: Anytime you are sleeping, including during daytime naps. While taking prescription pain medicines, sleeping medicines, or medicines that make you drowsy. Have a responsible adult stay with you for the time you are told. It is important to have someone help care for you until you are awake and alert. Return to your normal activities as told by your  health care provider. Ask your health care provider what activities are safe for you. Take over-the-counter and prescription medicines only as told by your health care provider. If you smoke, do not smoke without supervision. Keep all follow-up visits as told by your health care provider. This is important. Contact a health care provider if: You have nausea or vomiting that does not get better with medicine. You cannot eat or drink without vomiting. You have pain that does not get better with medicine. You are unable to pass urine. You develop a skin rash. You have a fever. You have redness around your IV site that gets worse. Get help right away if: You have difficulty breathing. You have chest pain. You have blood in your urine or stool, or you vomit blood. Summary After the procedure, it is common to have a  sore throat or nausea. It is also common to feel tired. Have a responsible adult stay with you for the time you are told. It is important to have someone help care for you until you are awake and alert. When you feel hungry, start by eating small amounts of foods that are soft and easy to digest (bland), such as toast. Gradually return to your regular diet. Drink enough fluid to keep your urine pale yellow. Return to your normal activities as told by your health care provider. Ask your health care provider what activities are safe for you. This information is not intended to replace advice given to you by your health care provider. Make sure you discuss any questions you have with your health care provider. Document Revised: 07/07/2020 Document Reviewed: 02/04/2020 Elsevier Patient Education  [place]. How to Use Chlorhexidine for Bathing Chlorhexidine gluconate (CHG) is a germ-killing (antiseptic) solution that is used to clean the skin. It can get rid of the bacteria that normally live on the skin and can keep them away for about 24 hours. To clean your skin with CHG, you may be given: A CHG solution to use in the shower or as part of a sponge bath. A prepackaged cloth that contains CHG. Cleaning your skin with CHG may help lower the risk for infection: While you are staying in the intensive care unit of the hospital. If you have a vascular access, such as a central line, to provide short-term or long-term access to your veins. If you have a catheter to drain urine from your bladder. If you are on a ventilator. A ventilator is a machine that helps you breathe by moving air in and out of your lungs. After surgery. What are the risks? Risks of using CHG include: A skin reaction. Hearing loss, if CHG gets in your ears and you have a perforated eardrum. Eye injury, if CHG gets in your eyes and is not rinsed out. The CHG product catching fire. Make sure that you avoid smoking and  flames after applying CHG to your skin. Do not use CHG: If you have a chlorhexidine allergy or have previously reacted to chlorhexidine. On babies younger than 28 months of age. How to use CHG solution Use CHG only as told by your health care provider, and follow the instructions on the label. Use the full amount of CHG as directed. Usually, this is one bottle. During a shower Follow these steps when using CHG solution during a shower (unless your health care provider gives you different instructions): Start the shower. Use your normal soap and shampoo to wash your face and hair. Turn off the  shower or move out of the shower stream. Pour the CHG onto a clean washcloth. Do not use any type of brush or rough-edged sponge. Starting at your neck, lather your body down to your toes. Make sure you follow these instructions: If you will be having surgery, pay special attention to the part of your body where you will be having surgery. Scrub this area for at least 1 minute. Do not use CHG on your head or face. If the solution gets into your ears or eyes, rinse them well with water. Avoid your genital area. Avoid any areas of skin that have broken skin, cuts, or scrapes. Scrub your back and under your arms. Make sure to wash skin folds. Let the lather sit on your skin for 1-2 minutes or as long as told by your health care provider. Thoroughly rinse your entire body in the shower. Make sure that all body creases and crevices are rinsed well. Dry off with a clean towel. Do not put any substances on your body afterward--such as powder, lotion, or perfume--unless you are told to do so by your health care provider. Only use lotions that are recommended by the manufacturer. Put on clean clothes or pajamas. If it is the night before your surgery, sleep in clean sheets.  During a sponge bath Follow these steps when using CHG solution during a sponge bath (unless your health care provider gives you different  instructions): Use your normal soap and shampoo to wash your face and hair. Pour the CHG onto a clean washcloth. Starting at your neck, lather your body down to your toes. Make sure you follow these instructions: If you will be having surgery, pay special attention to the part of your body where you will be having surgery. Scrub this area for at least 1 minute. Do not use CHG on your head or face. If the solution gets into your ears or eyes, rinse them well with water. Avoid your genital area. Avoid any areas of skin that have broken skin, cuts, or scrapes. Scrub your back and under your arms. Make sure to wash skin folds. Let the lather sit on your skin for 1-2 minutes or as long as told by your health care provider. Using a different clean, wet washcloth, thoroughly rinse your entire body. Make sure that all body creases and crevices are rinsed well. Dry off with a clean towel. Do not put any substances on your body afterward--such as powder, lotion, or perfume--unless you are told to do so by your health care provider. Only use lotions that are recommended by the manufacturer. Put on clean clothes or pajamas. If it is the night before your surgery, sleep in clean sheets. How to use CHG prepackaged cloths Only use CHG cloths as told by your health care provider, and follow the instructions on the label. Use the CHG cloth on clean, dry skin. Do not use the CHG cloth on your head or face unless your health care provider tells you to. When washing with the CHG cloth: Avoid your genital area. Avoid any areas of skin that have broken skin, cuts, or scrapes. Before surgery Follow these steps when using a CHG cloth to clean before surgery (unless your health care provider gives you different instructions): Using the CHG cloth, vigorously scrub the part of your body where you will be having surgery. Scrub using a back-and-forth motion for 3 minutes. The area on your body should be completely wet  with CHG when you are done  scrubbing. Do not rinse. Discard the cloth and let the area air-dry. Do not put any substances on the area afterward, such as powder, lotion, or perfume. Put on clean clothes or pajamas. If it is the night before your surgery, sleep in clean sheets.  For general bathing Follow these steps when using CHG cloths for general bathing (unless your health care provider gives you different instructions). Use a separate CHG cloth for each area of your body. Make sure you wash between any folds of skin and between your fingers and toes. Wash your body in the following order, switching to a new cloth after each step: The front of your neck, shoulders, and chest. Both of your arms, under your arms, and your hands. Your stomach and groin area, avoiding the genitals. Your right leg and foot. Your left leg and foot. The back of your neck, your back, and your buttocks. Do not rinse. Discard the cloth and let the area air-dry. Do not put any substances on your body afterward--such as powder, lotion, or perfume--unless you are told to do so by your health care provider. Only use lotions that are recommended by the manufacturer. Put on clean clothes or pajamas. Contact a health care provider if: Your skin gets irritated after scrubbing. You have questions about using your solution or cloth. You swallow any chlorhexidine. Call your local poison control center (1-575 843 0727 in the [place] Get help right away if: Your eyes itch badly, or they become very red or swollen. Your skin itches badly and is red or swollen. Your hearing changes. You have trouble seeing. You have swelling or tingling in your mouth or throat. You have trouble breathing. These symptoms may represent a serious problem that is an emergency. Do not wait to see if the symptoms will go away. Get medical help right away. Call your local emergency services (911 in the [place] Do not drive yourself to the  hospital. Summary Chlorhexidine gluconate (CHG) is a germ-killing (antiseptic) solution that is used to clean the skin. Cleaning your skin with CHG may help to lower your risk for infection. You may be given CHG to use for bathing. It may be in a bottle or in a prepackaged cloth to use on your skin. Carefully follow your health care provider's instructions and the instructions on the product label. Do not use CHG if you have a chlorhexidine allergy. Contact your health care provider if your skin gets irritated after scrubbing. This information is not intended to replace advice given to you by your health care provider. Make sure you discuss any questions you have with your health care provider. Document Revised: 01/02/2021 Document Reviewed: 01/02/2021 Elsevier Patient Education  [place].

## 2022-06-01 ENCOUNTER — Encounter (HOSPITAL_COMMUNITY): Payer: Self-pay

## 2022-06-01 ENCOUNTER — Encounter (HOSPITAL_COMMUNITY)
Admission: RE | Admit: 2022-06-01 | Discharge: 2022-06-01 | Disposition: A | Discharge status: Home / Self Care | Payer: Medicare Other | Source: Ambulatory Visit | Attending: General Surgery | Admitting: General Surgery

## 2022-06-01 VITALS — BP 134/76 | HR 81 | Temp 98.0°F | Resp 18 | Ht 67.0 in | Wt 162.0 lb

## 2022-06-01 DIAGNOSIS — Z01818 Encounter for other preprocedural examination: Secondary | ICD-10-CM | POA: Insufficient documentation

## 2022-06-01 DIAGNOSIS — I10 Essential (primary) hypertension: Secondary | ICD-10-CM | POA: Insufficient documentation

## 2022-06-01 DIAGNOSIS — E119 Type 2 diabetes mellitus without complications: Secondary | ICD-10-CM | POA: Diagnosis not present

## 2022-06-01 HISTORY — DX: Anxiety disorder, unspecified: F41.9

## 2022-06-01 HISTORY — DX: Depression, unspecified: F32.A

## 2022-06-01 LAB — BASIC METABOLIC PANEL
Anion gap: 8 (ref 5–15)
BUN: 21 mg/dL (ref 8–23)
CO2: 22 mmol/L (ref 22–32)
Calcium: 9.4 mg/dL (ref 8.9–10.3)
Chloride: 107 mmol/L (ref 98–111)
Creatinine, Ser: 0.93 mg/dL (ref 0.44–1.00)
GFR, Estimated: >60 mL/min (ref >60)
Glucose, Bld: 140 mg/dL — ABNORMAL HIGH (ref 70–99)
Potassium: 4.3 mmol/L (ref 3.5–5.1)
Sodium: 137 mmol/L (ref 135–145)

## 2022-06-01 LAB — HEMOGLOBIN A1C
Hgb A1c MFr Bld: 7.9 % — ABNORMAL HIGH (ref 4.8–5.6)
Mean Plasma Glucose: 180.03 mg/dL

## 2022-06-05 ENCOUNTER — Encounter: Payer: Self-pay | Admitting: *Deleted

## 2022-06-06 ENCOUNTER — Ambulatory Visit (HOSPITAL_COMMUNITY)
Admission: RE | Admit: 2022-06-06 | Discharge: 2022-06-06 | Disposition: A | Discharge status: Home / Self Care | Payer: Medicare Other | Source: Ambulatory Visit | Attending: General Surgery | Admitting: General Surgery

## 2022-06-06 ENCOUNTER — Ambulatory Visit (HOSPITAL_COMMUNITY): Payer: Medicare Other

## 2022-06-06 ENCOUNTER — Encounter (HOSPITAL_COMMUNITY)
Admission: RE | Disposition: A | Discharge status: Home / Self Care | Payer: Self-pay | Source: Ambulatory Visit | Attending: General Surgery

## 2022-06-06 ENCOUNTER — Ambulatory Visit (HOSPITAL_BASED_OUTPATIENT_CLINIC_OR_DEPARTMENT_OTHER): Payer: Medicare Other

## 2022-06-06 ENCOUNTER — Encounter (HOSPITAL_COMMUNITY): Payer: Self-pay | Admitting: General Surgery

## 2022-06-06 ENCOUNTER — Other Ambulatory Visit: Payer: Self-pay

## 2022-06-06 DIAGNOSIS — I10 Essential (primary) hypertension: Secondary | ICD-10-CM | POA: Insufficient documentation

## 2022-06-06 DIAGNOSIS — R221 Localized swelling, mass and lump, neck: Secondary | ICD-10-CM | POA: Insufficient documentation

## 2022-06-06 DIAGNOSIS — L720 Epidermal cyst: Secondary | ICD-10-CM | POA: Insufficient documentation

## 2022-06-06 DIAGNOSIS — E119 Type 2 diabetes mellitus without complications: Secondary | ICD-10-CM | POA: Insufficient documentation

## 2022-06-06 DIAGNOSIS — Z87891 Personal history of nicotine dependence: Secondary | ICD-10-CM | POA: Insufficient documentation

## 2022-06-06 DIAGNOSIS — D367 Benign neoplasm of other specified sites: Secondary | ICD-10-CM | POA: Diagnosis present

## 2022-06-06 HISTORY — PX: MASS EXCISION: SHX2000

## 2022-06-06 LAB — GLUCOSE, CAPILLARY
Glucose-Capillary: 146 mg/dL — ABNORMAL HIGH (ref 70–99)
Glucose-Capillary: 185 mg/dL — ABNORMAL HIGH (ref 70–99)

## 2022-06-06 SURGERY — EXCISION MASS
Anesthesia: General | Site: Neck

## 2022-06-06 MED ORDER — CHLORHEXIDINE GLUCONATE 0.12 % MT SOLN
OROMUCOSAL | Status: AC
  Filled 2022-06-06: qty 15

## 2022-06-06 MED ORDER — PROPOFOL 10 MG/ML IV BOLUS
INTRAVENOUS | Status: DC | PRN
  Administered 2022-06-06: 150 mg via INTRAVENOUS

## 2022-06-06 MED ORDER — ONDANSETRON HCL 4 MG/2ML IJ SOLN
INTRAMUSCULAR | Status: DC | PRN
  Administered 2022-06-06: 4 mg via INTRAVENOUS

## 2022-06-06 MED ORDER — FENTANYL CITRATE PF 50 MCG/ML IJ SOSY: 25.0000 ug | PREFILLED_SYRINGE | INTRAMUSCULAR | Status: DC | PRN

## 2022-06-06 MED ORDER — ROCURONIUM BROMIDE 10 MG/ML (PF) SYRINGE
PREFILLED_SYRINGE | INTRAVENOUS | Status: AC
  Filled 2022-06-06: qty 10

## 2022-06-06 MED ORDER — LIDOCAINE HCL (CARDIAC) PF 100 MG/5ML IV SOSY
PREFILLED_SYRINGE | INTRAVENOUS | Status: DC | PRN
  Administered 2022-06-06: 80 mg via INTRAVENOUS

## 2022-06-06 MED ORDER — FENTANYL CITRATE (PF) 100 MCG/2ML IJ SOLN
INTRAMUSCULAR | Status: AC
  Filled 2022-06-06: qty 2

## 2022-06-06 MED ORDER — ONDANSETRON HCL 4 MG/2ML IJ SOLN: 4.0000 mg | Freq: Once | INTRAMUSCULAR | Status: DC | PRN

## 2022-06-06 MED ORDER — CEFAZOLIN SODIUM-DEXTROSE 2-4 GM/100ML-% IV SOLN
2.0000 g | INTRAVENOUS | Status: AC
  Administered 2022-06-06: 2 g via INTRAVENOUS

## 2022-06-06 MED ORDER — DEXAMETHASONE SODIUM PHOSPHATE 10 MG/ML IJ SOLN
INTRAMUSCULAR | Status: DC | PRN
  Administered 2022-06-06: 4 mg via INTRAVENOUS

## 2022-06-06 MED ORDER — SUCCINYLCHOLINE CHLORIDE 200 MG/10ML IV SOSY
PREFILLED_SYRINGE | INTRAVENOUS | Status: DC | PRN
  Administered 2022-06-06: 100 mg via INTRAVENOUS

## 2022-06-06 MED ORDER — FENTANYL CITRATE (PF) 100 MCG/2ML IJ SOLN
INTRAMUSCULAR | Status: DC | PRN
  Administered 2022-06-06 (×2): 50 ug via INTRAVENOUS

## 2022-06-06 MED ORDER — MIDAZOLAM HCL 2 MG/2ML IJ SOLN
INTRAMUSCULAR | Status: AC
  Filled 2022-06-06: qty 2

## 2022-06-06 MED ORDER — BUPIVACAINE HCL (PF) 0.5 % IJ SOLN
INTRAMUSCULAR | Status: AC
  Filled 2022-06-06: qty 30

## 2022-06-06 MED ORDER — LACTATED RINGERS IV SOLN: INTRAVENOUS | Status: DC | PRN

## 2022-06-06 MED ORDER — CEFAZOLIN SODIUM-DEXTROSE 2-4 GM/100ML-% IV SOLN
INTRAVENOUS | Status: AC
  Filled 2022-06-06: qty 100

## 2022-06-06 MED ORDER — CHLORHEXIDINE GLUCONATE CLOTH 2 % EX PADS: 6.0000 | MEDICATED_PAD | Freq: Once | CUTANEOUS | Status: DC

## 2022-06-06 MED ORDER — TRIPLE ANTIBIOTIC 3.5-400-5000 EX OINT
TOPICAL_OINTMENT | CUTANEOUS | Status: AC
  Filled 2022-06-06: qty 1

## 2022-06-06 MED ORDER — DEXAMETHASONE SODIUM PHOSPHATE 10 MG/ML IJ SOLN
INTRAMUSCULAR | Status: AC
  Filled 2022-06-06: qty 1

## 2022-06-06 MED ORDER — PROPOFOL 10 MG/ML IV BOLUS
INTRAVENOUS | Status: AC
  Filled 2022-06-06: qty 20

## 2022-06-06 MED ORDER — DEXMEDETOMIDINE (PRECEDEX) IN NS 20 MCG/5ML (4 MCG/ML) IV SYRINGE
PREFILLED_SYRINGE | INTRAVENOUS | Status: DC | PRN
  Administered 2022-06-06: 4 ug via INTRAVENOUS

## 2022-06-06 MED ORDER — TRIPLE ANTIBIOTIC 3.5-400-5000 EX OINT
TOPICAL_OINTMENT | CUTANEOUS | Status: DC | PRN
  Administered 2022-06-06: 1 via TOPICAL

## 2022-06-06 MED ORDER — SUGAMMADEX SODIUM 200 MG/2ML IV SOLN
INTRAVENOUS | Status: DC | PRN
  Administered 2022-06-06: 150 mg via INTRAVENOUS

## 2022-06-06 MED ORDER — BUPIVACAINE HCL (PF) 0.5 % IJ SOLN
INTRAMUSCULAR | Status: DC | PRN
  Administered 2022-06-06: 20 mL

## 2022-06-06 MED ORDER — LIDOCAINE HCL (PF) 2 % IJ SOLN
INTRAMUSCULAR | Status: AC
  Filled 2022-06-06: qty 5

## 2022-06-06 MED ORDER — MIDAZOLAM HCL 5 MG/5ML IJ SOLN
INTRAMUSCULAR | Status: DC | PRN
  Administered 2022-06-06: 2 mg via INTRAVENOUS

## 2022-06-06 MED ORDER — ONDANSETRON HCL 4 MG/2ML IJ SOLN
INTRAMUSCULAR | Status: AC
  Filled 2022-06-06: qty 2

## 2022-06-06 MED ORDER — ROCURONIUM BROMIDE 100 MG/10ML IV SOLN
INTRAVENOUS | Status: DC | PRN
  Administered 2022-06-06: 10 mg via INTRAVENOUS

## 2022-06-06 MED ORDER — OXYCODONE HCL 5 MG PO TABS: 5.0000 mg | ORAL_TABLET | ORAL | 0 refills | Status: AC | PRN

## 2022-06-06 MED ORDER — 0.9 % SODIUM CHLORIDE (POUR BTL) OPTIME
TOPICAL | Status: DC | PRN
  Administered 2022-06-06: 1000 mL

## 2022-06-06 MED ORDER — ONDANSETRON HCL 4 MG PO TABS: 4.0000 mg | ORAL_TABLET | Freq: Three times a day (TID) | ORAL | 1 refills | Status: AC | PRN

## 2022-06-06 SURGICAL SUPPLY — 31 items
APL PRP STRL LF ISPRP CHG 10.5 (MISCELLANEOUS) ×1
APL SKNCLS STERI-STRIP NONHPOA (GAUZE/BANDAGES/DRESSINGS) ×1
APPLICATOR CHLORAPREP 10.5 ORG (MISCELLANEOUS) ×2 IMPLANT
BENZOIN TINCTURE PRP APPL 2/3 (GAUZE/BANDAGES/DRESSINGS) ×1 IMPLANT
CLOTH BEACON ORANGE TIMEOUT ST (SAFETY) ×2 IMPLANT
COVER LIGHT HANDLE STERIS (MISCELLANEOUS) ×4 IMPLANT
DECANTER SPIKE VIAL GLASS SM (MISCELLANEOUS) ×2 IMPLANT
DRSG TEGADERM 4X4.75 (GAUZE/BANDAGES/DRESSINGS) ×1 IMPLANT
ELECT REM PT RETURN 9FT ADLT (ELECTROSURGICAL) ×2
ELECTRODE REM PT RTRN 9FT ADLT (ELECTROSURGICAL) ×1 IMPLANT
GLOVE BIO SURGEON STRL SZ 6.5 (GLOVE) ×2 IMPLANT
GLOVE BIOGEL PI IND STRL 6.5 (GLOVE) ×1 IMPLANT
GLOVE BIOGEL PI IND STRL 7.0 (GLOVE) ×2 IMPLANT
GLOVE BIOGEL PI INDICATOR 6.5 (GLOVE) ×1
GLOVE BIOGEL PI INDICATOR 7.0 (GLOVE) ×2
GOWN STRL REUS W/TWL LRG LVL3 (GOWN DISPOSABLE) ×4 IMPLANT
KIT TURNOVER KIT A (KITS) ×2 IMPLANT
MANIFOLD NEPTUNE II (INSTRUMENTS) ×2 IMPLANT
NDL HYPO 25X1 1.5 SAFETY (NEEDLE) ×1 IMPLANT
NEEDLE HYPO 25X1 1.5 SAFETY (NEEDLE) ×2 IMPLANT
NS IRRIG 1000ML POUR BTL (IV SOLUTION) ×2 IMPLANT
PACK MINOR (CUSTOM PROCEDURE TRAY) ×1 IMPLANT
PAD ARMBOARD 7.5X6 YLW CONV (MISCELLANEOUS) ×2 IMPLANT
SET BASIN LINEN APH (SET/KITS/TRAYS/PACK) ×2 IMPLANT
SPONGE GAUZE 4X4 12PLY (GAUZE/BANDAGES/DRESSINGS) ×1 IMPLANT
SUT ETHILON 3 0 FSL (SUTURE) ×1 IMPLANT
SUT MNCRL AB 4-0 PS2 18 (SUTURE) ×2 IMPLANT
SUT VIC AB 3-0 SH 27 (SUTURE) ×2
SUT VIC AB 3-0 SH 27X BRD (SUTURE) IMPLANT
SYR BULB IRRIG 60ML STRL (SYRINGE) ×1 IMPLANT
SYR CONTROL 10ML LL (SYRINGE) ×2 IMPLANT

## 2022-06-06 NOTE — Anesthesia Procedure Notes (Signed)
Procedure Name: Intubation Date/Time: 06/06/2022 11:44 AM  Performed by: Louann Sjogren, MDPre-anesthesia Checklist: Patient identified, Emergency Drugs available, Suction available and Patient being monitored Patient Re-evaluated:Patient Re-evaluated prior to induction Oxygen Delivery Method: Circle system utilized Preoxygenation: Pre-oxygenation with 100% oxygen Induction Type: IV induction Ventilation: Mask ventilation without difficulty Grade View: Grade II Tube type: Oral Tube size: 7.0 mm Number of attempts: 1 Airway Equipment and Method: Stylet Placement Confirmation: ETT inserted through vocal cords under direct vision, positive ETCO2 and breath sounds checked- equal and bilateral Secured at: 24 cm Tube secured with: Tape Dental Injury: Teeth and Oropharynx as per pre-operative assessment

## 2022-06-06 NOTE — Op Note (Signed)
[facility] Operative Note  06/06/22  Preoperative Diagnosis:  Posterior Neck Mass    Postoperative Diagnosis: Same   Procedure(s) Performed: Excision of neck mass 4cm    Surgeon: Lanell Matar. Constance Haw, MD   Assistants: No qualified resident was available    Anesthesia: General endotracheal   Anesthesiologist: Louann Sjogren, MD    Specimens: Cyst from posterior neck    Estimated Blood Loss: Minimal   Blood Replacement: None    Complications: None   Wound Class: Clean contaminated    Operative Indications: Ms. Ferrero is a 66 yo who comes in with a large cyst on the neck that has been drained a few times. We discussed excision and removal of the main cyst and a smaller one just inferior. We discussed risk of bleeding, infection, recurrence, and need for sutures and potential for packing.   Findings: Large 3cm cyst and smaller 4cm cyst    Procedure: The patient was taken to the operating room and placed supine. General anesthesia was induced. Intravenous antibiotics were administered per protocol.  She was then placed in right lateral position. The posterior neck was prepared and draped in the usual sterile fashion.   An incision was made in the lower aspect of the large cyst to make it possible to remove both through one incision. With sharp dissection with scissors the larger cyst was removed and the inferior smaller cyst was removed. The total size was 4cm. There was dark keratin like exudate that did come out of some of the cyst. The wound was irrigated and the deeper wound was closed with 3-0 Vicryl and the skin was closed with 3-0 Nylon interrupted and vertical mattress. The skin was closed with neosporin and gauze and tegaderm.   Final inspection revealed acceptable hemostasis. All counts were correct at the end of the case. The patient was awakened from anesthesia and extubated without complication.  The patient went to the PACU in stable condition.    Curlene Labrum, MD [facility] [address] [place], [place] [address] [phone] (office)

## 2022-06-06 NOTE — Transfer of Care (Signed)
Immediate Anesthesia Transfer of Care Note  Patient: Grace Thompson  Procedure(s) Performed: EXCISION MASS POSTERIOR NECK (Neck)  Patient Location: PACU  Anesthesia Type:General  Level of Consciousness: awake and patient cooperative  Airway & Oxygen Therapy: Patient Spontanous Breathing  Post-op Assessment: Report given to RN and Post -op Vital signs reviewed and stable  Post vital signs: Reviewed and stable  Last Vitals:  Vitals Value Taken Time  BP 150/71 06/06/22 1232  Temp 97.6 06/06/22 1232  Pulse 73 06/06/22 1232  Resp 18 06/06/22 1232  SpO2 98 % 06/06/22 1232  Vitals shown include unvalidated device data.  Last Pain:  Vitals:   06/06/22 1000  PainSc: 8          Complications: No notable events documented.

## 2022-06-06 NOTE — Progress Notes (Signed)
[facility]  Patient did not want me to notify any family or friends. Her friend that is picking her up can be called when she is ready for dc.   Rx sent to Pharmacy. Sutures out 8/16.  Future Appointments  Date Time Provider [place]  06/20/2022 12:30 PM Virl Cagey, MD [facility] None     Curlene Labrum, MD [facility] [address] [place], [place] [address] [phone] (office)

## 2022-06-06 NOTE — Interval H&P Note (Signed)
History and Physical Interval Note:  06/06/2022 10:57 AM  Grace Thompson  has presented today for surgery, with the diagnosis of POSTERIOR NECK MASS/ CYST 4 CM.  The various methods of treatment have been discussed with the patient and family. After consideration of risks, benefits and other options for treatment, the patient has consented to  Procedure(s): EXCISION MASS POSTERIOR NECK (N/A) as a surgical intervention.  The patient's history has been reviewed, patient examined, no change in status, stable for surgery.  I have reviewed the patient's chart and labs.  Questions were answered to the patient's satisfaction.     Virl Cagey

## 2022-06-06 NOTE — Anesthesia Preprocedure Evaluation (Signed)
Anesthesia Evaluation  Patient identified by MRN, date of birth, ID band Patient awake    Reviewed: Allergy & Precautions, H&P , NPO status , Patient's Chart, lab work & pertinent test results, reviewed documented beta blocker date and time   Airway Mallampati: II  TM Distance: >3 FB Neck ROM: full    Dental no notable dental hx.    Pulmonary neg pulmonary ROS, Current Smoker and Patient abstained from smoking.,    Pulmonary exam normal breath sounds clear to auscultation       Cardiovascular Exercise Tolerance: Good hypertension, negative cardio ROS   Rhythm:regular Rate:Normal     Neuro/Psych PSYCHIATRIC DISORDERS Anxiety Depression negative neurological ROS     GI/Hepatic negative GI ROS, Neg liver ROS,   Endo/Other  negative endocrine ROSdiabetes, Type 2  Renal/GU negative Renal ROS  negative genitourinary   Musculoskeletal   Abdominal   Peds  Hematology negative hematology ROS (+)   Anesthesia Other Findings   Reproductive/Obstetrics negative OB ROS                             Anesthesia Physical Anesthesia Plan  ASA: 2  Anesthesia Plan: General   Post-op Pain Management:    Induction:   PONV Risk Score and Plan: Ondansetron  Airway Management Planned:   Additional Equipment:   Intra-op Plan:   Post-operative Plan:   Informed Consent: I have reviewed the patients History and Physical, chart, labs and discussed the procedure including the risks, benefits and alternatives for the proposed anesthesia with the patient or authorized representative who has indicated his/her understanding and acceptance.     Dental Advisory Given  Plan Discussed with: CRNA  Anesthesia Plan Comments:         Anesthesia Quick Evaluation

## 2022-06-06 NOTE — Discharge Instructions (Signed)
Discharge Instructions:  Common Complaints: Pain at the incision site is common.  Some nausea is common and poor appetite. The main goal is to stay hydrated the first few days after surgery.  Bruising is common and soreness. Some drainage from the bandage is expected.   Diet/ Activity: Diet as tolerated. You may not have an appetite, but it is important to stay hydrated. Drink 64 ounces of water a day. Your appetite will return with time.  Walk everyday for at least 15-20 minutes. Deep cough and move around every 1-2 hours in the first few days after surgery.  Keep the bandage on for 48 hours, remove it on 06/08/2022.  After it is removed you can replace a bandaid and neosporin daily. After 8/4 you can shower. Limit stretching, pulling on your incision if it is located on other parts of your body.    Medication: Take tylenol and ibuprofen as needed for pain control, alternating every 4-6 hours.  Example:  Tylenol '1000mg'$  @ 6am, 12noon, 6pm, 3mdnight (Do not exceed '4000mg'$  of tylenol a day). Ibuprofen '800mg'$  @ 9am, 3pm, 9pm, 3am (Do not exceed '3600mg'$  of ibuprofen a day).  Take Roxicodone for breakthrough pain every 4 hours.  Take Colace for constipation related to narcotic pain medication. If you do not have a bowel movement in 2 days, take Miralax over the counter.  Drink plenty of water to also prevent constipation.   Contact Information: If you have questions or concerns, please call our office, [phone] Monday- Thursday 8AM-5PM and Friday 8AM-12Noon.  If it is after hours or on the weekend, please call [facility], [phone] and ask to speak to the surgeon on call for Dr. BConstance Hawat [facility]

## 2022-06-07 ENCOUNTER — Encounter (HOSPITAL_COMMUNITY): Payer: Self-pay | Admitting: General Surgery

## 2022-06-07 LAB — SURGICAL PATHOLOGY

## 2022-06-08 NOTE — Anesthesia Postprocedure Evaluation (Signed)
Anesthesia Post Note  Patient: Grace Thompson  Procedure(s) Performed: EXCISION MASS POSTERIOR NECK (Neck)  Patient location during evaluation: Phase II Anesthesia Type: General Level of consciousness: awake Pain management: pain level controlled Vital Signs Assessment: post-procedure vital signs reviewed and stable Respiratory status: spontaneous breathing and respiratory function stable Cardiovascular status: blood pressure returned to baseline and stable Postop Assessment: no headache and no apparent nausea or vomiting Anesthetic complications: no Comments: Late entry   No notable events documented.   Last Vitals:  Vitals:   06/06/22 1245 06/06/22 1256  BP: (!) 154/83 (!) 153/76  Pulse: 71 80  Resp: 15 16  Temp:    SpO2: 100% 100%    Last Pain:  Vitals:   06/07/22 1433  PainSc: 0-No pain                 Louann Sjogren

## 2022-06-20 ENCOUNTER — Encounter: Payer: Self-pay | Admitting: General Surgery

## 2022-06-20 ENCOUNTER — Ambulatory Visit (INDEPENDENT_AMBULATORY_CARE_PROVIDER_SITE_OTHER): Payer: Medicare Other | Admitting: General Surgery

## 2022-06-20 VITALS — BP 146/89 | HR 89 | Temp 98.1°F | Resp 16 | Ht 67.0 in | Wt 162.0 lb

## 2022-06-20 DIAGNOSIS — D367 Benign neoplasm of other specified sites: Secondary | ICD-10-CM

## 2022-06-20 NOTE — Patient Instructions (Signed)
Steristrips will peel off in the next 5-7 days. You can remove them once they are peeling off. It is ok to shower. Pat the area dry.   Activity as tolerated.

## 2022-06-20 NOTE — Progress Notes (Unsigned)
[facility]  FINAL MICROSCOPIC DIAGNOSIS:   A.   NECK, SEBACEOUS, CYST, EXCISION:  -    Epidermal inclusion cyst.

## 2022-06-25 ENCOUNTER — Other Ambulatory Visit (HOSPITAL_COMMUNITY): Payer: Self-pay | Admitting: Family Medicine

## 2022-06-25 DIAGNOSIS — Z780 Asymptomatic menopausal state: Secondary | ICD-10-CM

## 2022-06-25 DIAGNOSIS — Z1231 Encounter for screening mammogram for malignant neoplasm of breast: Secondary | ICD-10-CM

## 2022-07-25 ENCOUNTER — Ambulatory Visit (HOSPITAL_COMMUNITY)
Admission: RE | Admit: 2022-07-25 | Discharge: 2022-07-25 | Disposition: A | Discharge status: Home / Self Care | Payer: Medicare Other | Source: Ambulatory Visit | Attending: Family Medicine | Admitting: Family Medicine

## 2022-07-25 DIAGNOSIS — Z780 Asymptomatic menopausal state: Secondary | ICD-10-CM | POA: Insufficient documentation

## 2022-07-25 DIAGNOSIS — Z794 Long term (current) use of insulin: Secondary | ICD-10-CM | POA: Insufficient documentation

## 2022-07-25 DIAGNOSIS — Z1231 Encounter for screening mammogram for malignant neoplasm of breast: Secondary | ICD-10-CM | POA: Diagnosis present

## 2022-07-25 DIAGNOSIS — Z13820 Encounter for screening for osteoporosis: Secondary | ICD-10-CM | POA: Insufficient documentation

## 2022-07-25 DIAGNOSIS — M85851 Other specified disorders of bone density and structure, right thigh: Secondary | ICD-10-CM | POA: Diagnosis not present

## 2022-07-25 DIAGNOSIS — E119 Type 2 diabetes mellitus without complications: Secondary | ICD-10-CM | POA: Insufficient documentation

## 2022-07-25 DIAGNOSIS — F17200 Nicotine dependence, unspecified, uncomplicated: Secondary | ICD-10-CM | POA: Diagnosis not present

## 2022-12-10 ENCOUNTER — Encounter: Payer: Self-pay | Admitting: *Deleted

## 2023-06-14 ENCOUNTER — Other Ambulatory Visit (HOSPITAL_COMMUNITY): Payer: Self-pay | Admitting: Adult Health

## 2023-06-14 DIAGNOSIS — Z1231 Encounter for screening mammogram for malignant neoplasm of breast: Secondary | ICD-10-CM

## 2023-06-14 DIAGNOSIS — Z13820 Encounter for screening for osteoporosis: Secondary | ICD-10-CM

## 2024-04-29 ENCOUNTER — Ambulatory Visit: Admitting: Orthopedic Surgery

## 2024-05-27 ENCOUNTER — Other Ambulatory Visit (HOSPITAL_COMMUNITY)

## 2024-05-27 ENCOUNTER — Ambulatory Visit (HOSPITAL_COMMUNITY)

## 2024-06-10 ENCOUNTER — Other Ambulatory Visit (HOSPITAL_COMMUNITY): Payer: Self-pay | Admitting: Adult Health

## 2024-06-10 ENCOUNTER — Other Ambulatory Visit (HOSPITAL_COMMUNITY)

## 2024-06-10 ENCOUNTER — Ambulatory Visit (HOSPITAL_COMMUNITY)

## 2024-06-10 DIAGNOSIS — M810 Age-related osteoporosis without current pathological fracture: Secondary | ICD-10-CM

## 2024-06-10 DIAGNOSIS — Z1231 Encounter for screening mammogram for malignant neoplasm of breast: Secondary | ICD-10-CM

## 2024-06-16 ENCOUNTER — Ambulatory Visit: Admitting: Orthopedic Surgery

## 2024-06-22 ENCOUNTER — Other Ambulatory Visit (HOSPITAL_COMMUNITY)

## 2024-06-22 ENCOUNTER — Inpatient Hospital Stay (HOSPITAL_COMMUNITY): Admission: RE | Admit: 2024-06-22 | Source: Ambulatory Visit

## 2024-07-01 ENCOUNTER — Encounter (HOSPITAL_COMMUNITY): Payer: Self-pay

## 2024-07-01 ENCOUNTER — Ambulatory Visit (HOSPITAL_COMMUNITY)
Admission: RE | Admit: 2024-07-01 | Discharge: 2024-07-01 | Disposition: A | Discharge status: Home / Self Care | Source: Ambulatory Visit | Attending: Adult Health

## 2024-07-01 ENCOUNTER — Ambulatory Visit (HOSPITAL_COMMUNITY)
Admission: RE | Admit: 2024-07-01 | Discharge: 2024-07-01 | Disposition: A | Discharge status: Home / Self Care | Source: Ambulatory Visit | Attending: Adult Health | Admitting: Adult Health

## 2024-07-01 DIAGNOSIS — Z1231 Encounter for screening mammogram for malignant neoplasm of breast: Secondary | ICD-10-CM

## 2024-07-01 DIAGNOSIS — M810 Age-related osteoporosis without current pathological fracture: Secondary | ICD-10-CM | POA: Insufficient documentation

## 2024-08-19 ENCOUNTER — Ambulatory Visit: Admitting: Orthopedic Surgery
